# Patient Record
Sex: Female | Born: 1980 | Race: Black or African American | Hispanic: No | Marital: Single | Smoking: Former smoker
Health system: Southern US, Community
[De-identification: ages and names within clinical notes are randomized; demographics above are authoritative.]

---

## 2005-02-06 ENCOUNTER — Emergency Department: Payer: Self-pay | Admitting: Emergency Medicine

## 2005-02-08 ENCOUNTER — Ambulatory Visit: Payer: Self-pay | Admitting: Emergency Medicine

## 2005-02-09 ENCOUNTER — Observation Stay: Payer: Self-pay

## 2006-12-22 ENCOUNTER — Emergency Department: Payer: Self-pay | Admitting: Internal Medicine

## 2008-01-30 ENCOUNTER — Emergency Department: Payer: Self-pay | Admitting: Emergency Medicine

## 2008-11-12 ENCOUNTER — Inpatient Hospital Stay: Payer: Self-pay

## 2009-10-08 ENCOUNTER — Emergency Department: Payer: Self-pay | Admitting: Emergency Medicine

## 2009-11-10 ENCOUNTER — Inpatient Hospital Stay: Payer: Self-pay | Admitting: Unknown Physician Specialty

## 2009-11-26 ENCOUNTER — Encounter: Payer: Self-pay | Admitting: Maternal & Fetal Medicine

## 2009-12-03 ENCOUNTER — Encounter: Payer: Self-pay | Admitting: Obstetrics and Gynecology

## 2010-02-03 ENCOUNTER — Observation Stay: Payer: Self-pay

## 2010-02-15 ENCOUNTER — Inpatient Hospital Stay: Payer: Self-pay | Admitting: Obstetrics and Gynecology

## 2011-08-06 IMAGING — US US FETAL BPP W/O NON-STRESS - NRPT
1 series · 12 of 12 positions shown · non-contrast
Comparison: none

[Series 1: us fetal bpp w/o non-stress - nrpt · 12 of 12 slices shown]
[im 1/12]
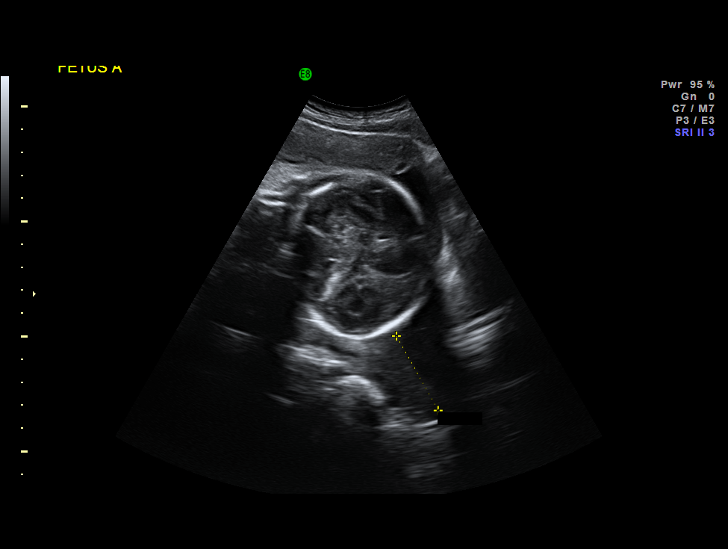
[im 2/12]
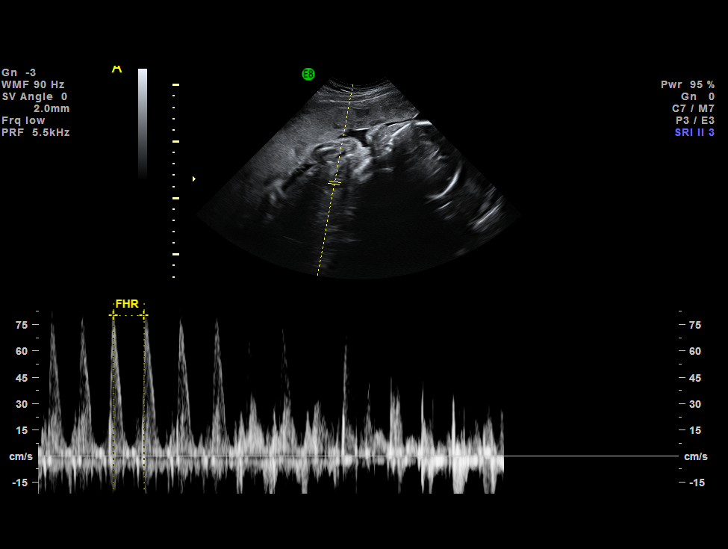
[im 3/12]
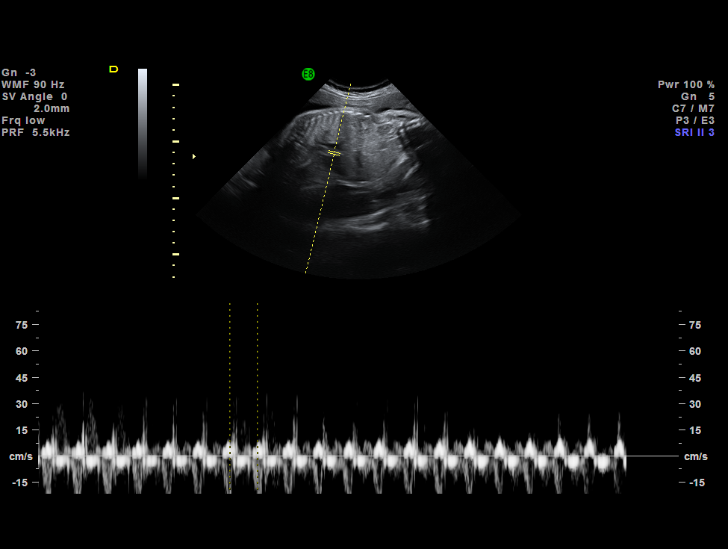
[im 4/12]
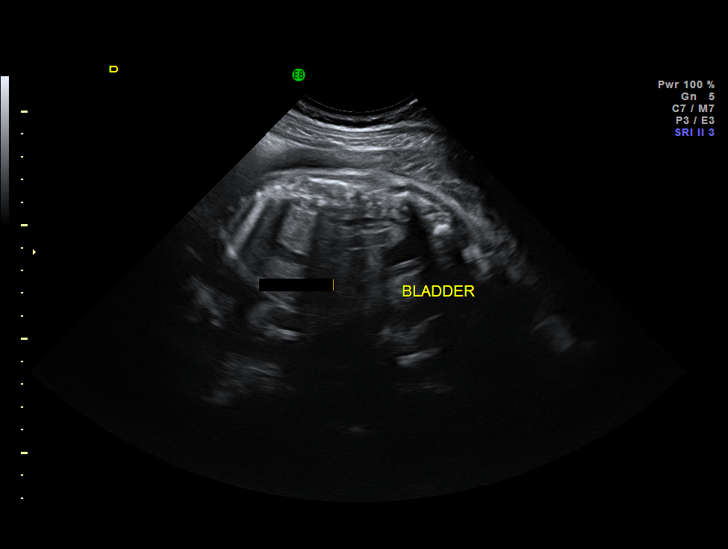
[im 5/12]
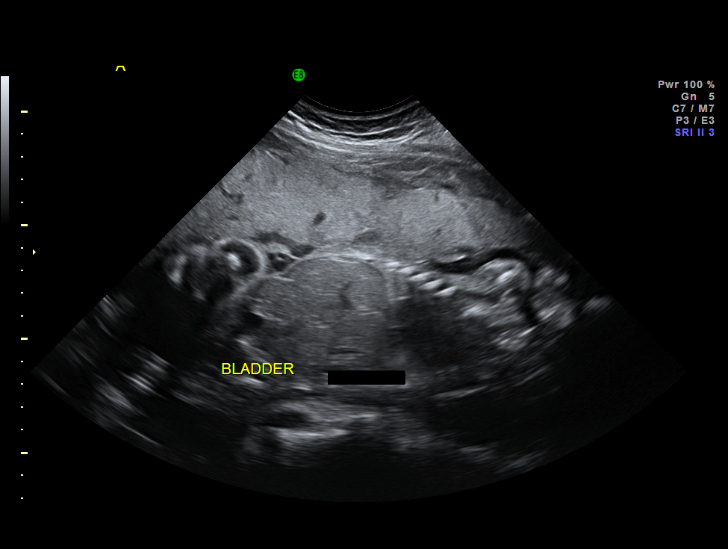
[im 6/12]
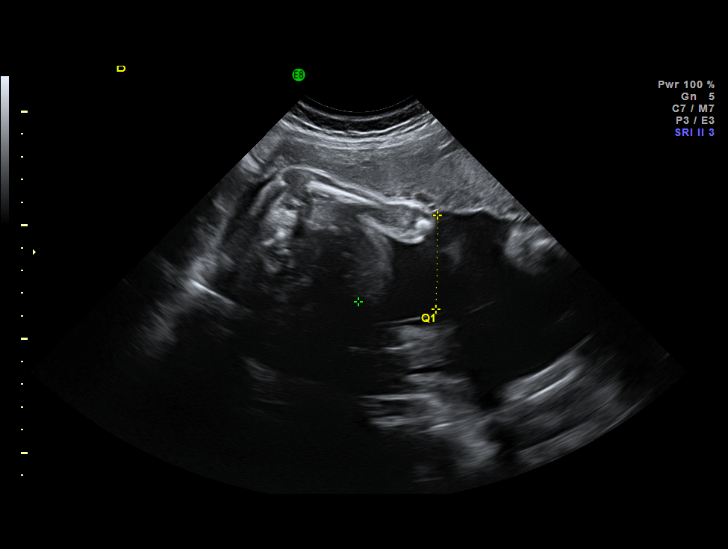
[im 7/12]
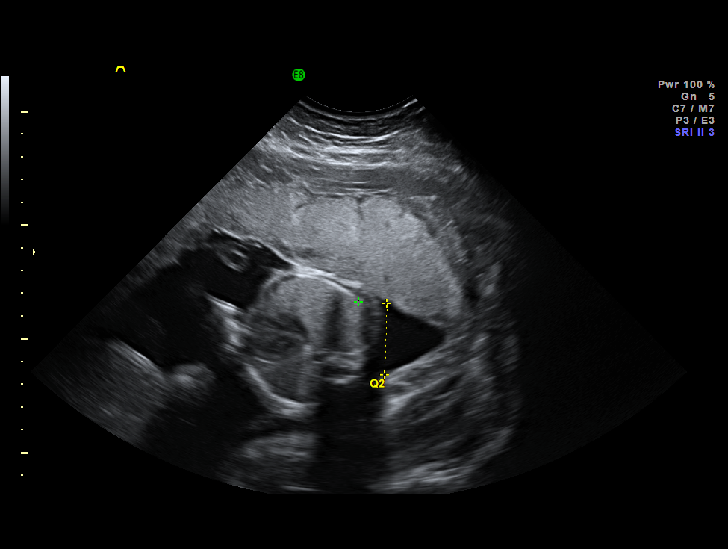
[im 8/12]
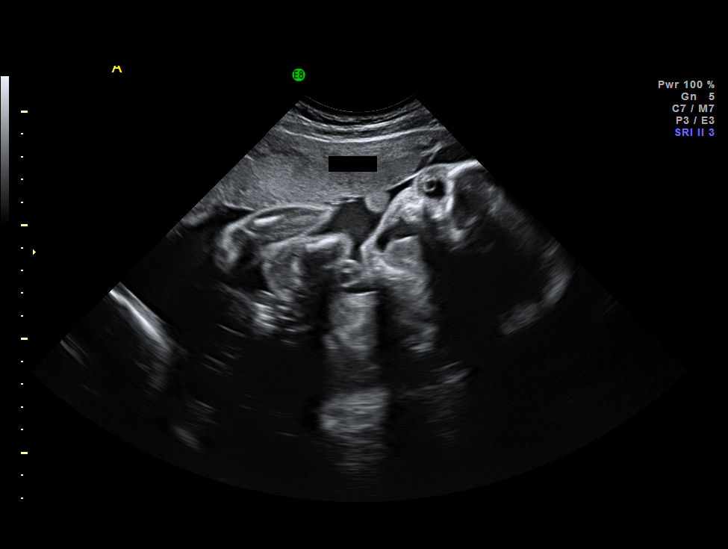
[im 9/12]
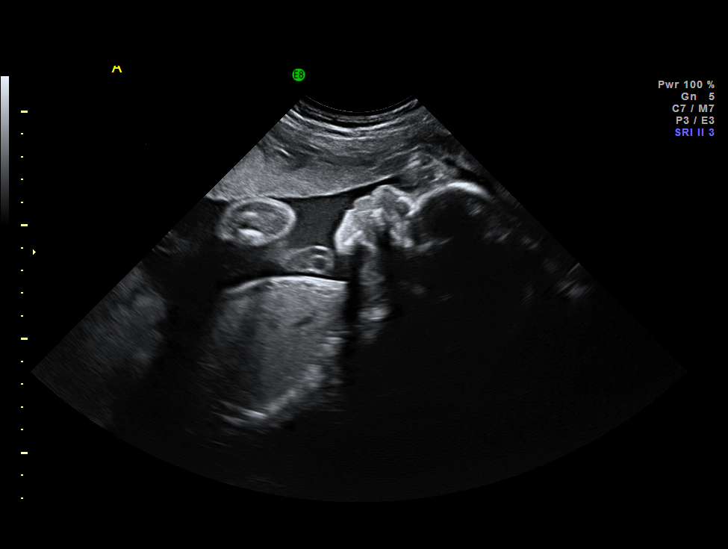
[im 10/12]
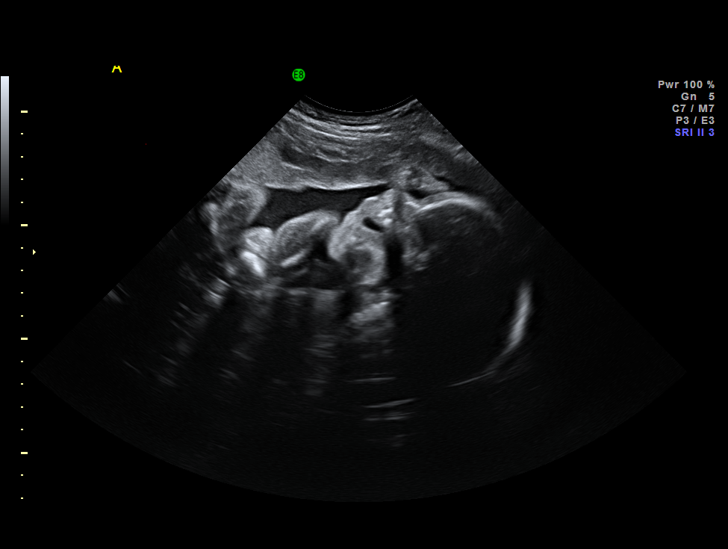
[im 11/12]
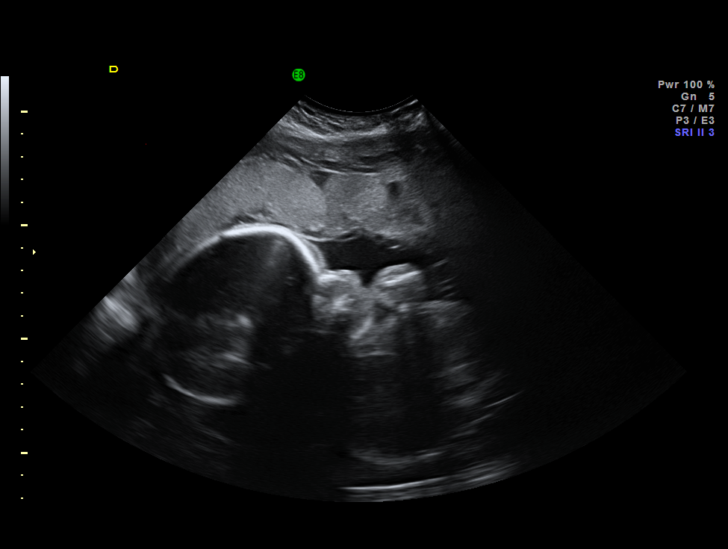
[im 12/12]
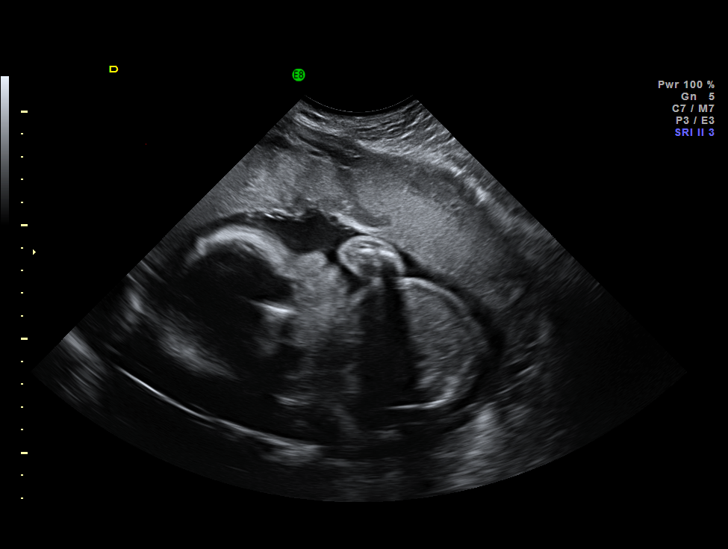

[12 of 12 positions shown; findings below may reference images not displayed]

IMAGES IMPORTED FROM THE [employer] WORKFLOW SYSTEM
NO DICTATION FOR STUDY

## 2015-02-21 ENCOUNTER — Encounter: Payer: Self-pay | Admitting: Emergency Medicine

## 2015-02-21 ENCOUNTER — Emergency Department
Admission: EM | Admit: 2015-02-21 | Discharge: 2015-02-21 | Disposition: A | Discharge status: Home / Self Care | Payer: Self-pay | Attending: Emergency Medicine | Admitting: Emergency Medicine

## 2015-02-21 DIAGNOSIS — Z720 Tobacco use: Secondary | ICD-10-CM | POA: Insufficient documentation

## 2015-02-21 DIAGNOSIS — R51 Headache: Secondary | ICD-10-CM | POA: Insufficient documentation

## 2015-02-21 DIAGNOSIS — H538 Other visual disturbances: Secondary | ICD-10-CM | POA: Insufficient documentation

## 2015-02-21 DIAGNOSIS — J020 Streptococcal pharyngitis: Secondary | ICD-10-CM | POA: Insufficient documentation

## 2015-02-21 DIAGNOSIS — R42 Dizziness and giddiness: Secondary | ICD-10-CM | POA: Insufficient documentation

## 2015-02-21 DIAGNOSIS — R739 Hyperglycemia, unspecified: Secondary | ICD-10-CM | POA: Insufficient documentation

## 2015-02-21 DIAGNOSIS — R531 Weakness: Secondary | ICD-10-CM | POA: Insufficient documentation

## 2015-02-21 LAB — BASIC METABOLIC PANEL
Anion gap: 9 (ref 5–15)
BUN: 9 mg/dL (ref 6–20)
CO2: 20 mmol/L — ABNORMAL LOW (ref 22–32)
CREATININE: 1 mg/dL (ref 0.44–1.00)
Calcium: 8.6 mg/dL — ABNORMAL LOW (ref 8.9–10.3)
Chloride: 101 mmol/L (ref 101–111)
GFR calc Af Amer: >60 mL/min (ref >60)
GFR calc non Af Amer: >60 mL/min (ref >60)
GLUCOSE: 202 mg/dL — AB (ref 65–99)
Potassium: 3.5 mmol/L (ref 3.5–5.1)
Sodium: 130 mmol/L — ABNORMAL LOW (ref 135–145)

## 2015-02-21 LAB — CBC
HCT: 37.9 % (ref 35.0–47.0)
Hemoglobin: 12.2 g/dL (ref 12.0–16.0)
MCH: 24.1 pg — ABNORMAL LOW (ref 26.0–34.0)
MCHC: 32.1 g/dL (ref 32.0–36.0)
MCV: 75 fL — ABNORMAL LOW (ref 80.0–100.0)
PLATELETS: 270 10*3/uL (ref 150–440)
RBC: 5.06 MIL/uL (ref 3.80–5.20)
RDW: 15.2 % — AB (ref 11.5–14.5)
WBC: 13.3 10*3/uL — ABNORMAL HIGH (ref 3.6–11.0)

## 2015-02-21 MED ORDER — SODIUM CHLORIDE 0.9 % IV BOLUS (SEPSIS)
1000.0000 mL | Freq: Once | INTRAVENOUS | Status: AC
  Administered 2015-02-21: 1000 mL via INTRAVENOUS

## 2015-02-21 MED ORDER — ACETAMINOPHEN 500 MG PO TABS
1000.0000 mg | ORAL_TABLET | Freq: Once | ORAL | Status: AC
  Administered 2015-02-21: 1000 mg via ORAL

## 2015-02-21 MED ORDER — PENICILLIN G BENZATHINE 1200000 UNIT/2ML IM SUSP
1.2000 10*6.[IU] | Freq: Once | INTRAMUSCULAR | Status: AC
  Administered 2015-02-21: 1.2 10*6.[IU] via INTRAMUSCULAR
  Filled 2015-02-21: qty 2

## 2015-02-21 MED ORDER — ACETAMINOPHEN 500 MG PO TABS
ORAL_TABLET | ORAL | Status: AC
  Filled 2015-02-21: qty 2

## 2015-02-21 MED ORDER — IBUPROFEN 800 MG PO TABS
800.0000 mg | ORAL_TABLET | Freq: Once | ORAL | Status: AC
  Administered 2015-02-21: 800 mg via ORAL
  Filled 2015-02-21: qty 1

## 2015-02-21 NOTE — ED Notes (Signed)
Pt states she continues to feel dizzy, md schaevitz notified.

## 2015-02-21 NOTE — ED Provider Notes (Addendum)
[facility] Emergency Department Provider Note  ____________________________________________  Time seen: Approximately 615 PM  I have reviewed the triage vital signs and the nursing notes.   HISTORY  Chief Complaint Headache; Neck Pain; Dizziness; Weakness; and Blurred Vision    HPI Madeline Montgomery is a 34 y.o. female without any pertinent medical history who presents today with anterior throat pain and anterior headache. She says that her symptoms began yesterday. She denies any sick contacts. Denies a cough, runny nose, nausea vomiting or diarrhea.   History reviewed. No pertinent past medical history.  There are no active problems to display for this patient.   History reviewed. No pertinent past surgical history.  No current outpatient prescriptions on file.  Allergies Review of patient's allergies indicates no known allergies.  No family history on file.  Social History History  Substance Use Topics  . Smoking status: Current Some Day Smoker -- 0.00 packs/day    Types: Cigarettes  . Smokeless tobacco: Not on file  . Alcohol Use: Yes     Comment: OCCAS    Review of Systems Constitutional: Fever and chills Eyes: No visual changes. ENT: Positive for sore throat. Cardiovascular: Denies chest pain. Respiratory: Denies shortness of breath. Gastrointestinal: No abdominal pain.  No nausea, no vomiting.  No diarrhea.  No constipation. Genitourinary: Negative for dysuria. Musculoskeletal: Negative for back pain. Skin: Negative for rash. Neurological: Negative for headaches, focal weakness or numbness.  10-point ROS otherwise negative.  ____________________________________________   PHYSICAL EXAM:  VITAL SIGNS: ED Triage Vitals  Enc Vitals Group     BP 02/21/15 1745 136/71 mmHg     Pulse Rate 02/21/15 1745 128     Resp 02/21/15 1745 18     Temp 02/21/15 1745 103 F (39.4 C)     Temp src --      SpO2 02/21/15 1745 97 %      Weight 02/21/15 1745 220 lb (99.791 kg)     Height 02/21/15 1745  (1.702 m)     Head Cir --      Peak Flow --      Pain Score 02/21/15 1746 10     Pain Loc --      Pain Edu? --      Excl. in GC? --     Constitutional: Alert and oriented. Well appearing and in no acute distress. Eyes: Conjunctivae are normal. PERRL. EOMI. Head: Atraumatic. Nose: No congestion/rhinnorhea. Mouth/Throat: Mucous membranes are moist.  Beefy-red pharynx with right sided pus overlying the tonsil. No tonsillar swelling or uvular deviation. The patient is controlling her secretions. Neck: No stridor.  Ranges freely. No meningismus. Hematological/Lymphatic/Immunilogical: Palpable and tender anterior cervical lymphadenopathy. Cardiovascular: Normal rate, regular rhythm. Grossly normal heart sounds.  Good peripheral circulation. Respiratory: Normal respiratory effort.  No retractions. Lungs CTAB. Gastrointestinal: Soft and nontender. No distention. No abdominal bruits. No CVA tenderness. Musculoskeletal: No lower extremity tenderness nor edema.  No joint effusions. Neurologic:  Normal speech and language. No gross focal neurologic deficits are appreciated. No gait instability. Skin:  Skin is warm, dry and intact. No rash noted. Psychiatric: Mood and affect are normal. Speech and behavior are normal.  ____________________________________________   LABS (all labs ordered are listed, but only abnormal results are displayed)  Labs Reviewed  CBC - Abnormal; Notable for the following:    WBC 13.3 (*)    MCV 75.0 (*)    MCH 24.1 (*)    RDW 15.2 (*)  All other components within normal limits  BASIC METABOLIC PANEL - Abnormal; Notable for the following:    Sodium 130 (*)    CO2 20 (*)    Glucose, Bld 202 (*)    Calcium 8.6 (*)    All other components within normal limits    ____________________________________________  EKG   ____________________________________________  RADIOLOGY   ____________________________________________   PROCEDURES    ____________________________________________   INITIAL IMPRESSION / ASSESSMENT AND PLAN / ED COURSE  Pertinent labs & imaging results that were available during my care of the patient were reviewed by me and considered in my medical decision making (see chart for details).  ----------------------------------------- 8:50 PM on 02/21/2015 -----------------------------------------  Patient with improved symptoms at this time. Recheck temperature and 99.1. Says it does have some mild lightheadedness but is no longer dizzy. Feeling overall improved. Symptoms likely all explained by her strep pharyngitis. I did discuss her elevated blood glucose with the patient. However, with a nonfasting glucose in the context of a fever and illness I will not start her on any anti-hyperglycemics. She will follow up with the primary care doctor. She has insurance and will be able to follow-up. I will give her the number for the [facility] clinic. ____________________________________________   FINAL CLINICAL IMPRESSION(S) / ED DIAGNOSES  Acute strep pharyngitis. Acute hyperglycemia. Initial visit.    Myrna Blazer, MD 02/21/15 2051  Patient knows to stay hydrated at home as well as to continue with ibuprofen and Tylenol for fever and pain relief.  Myrna Blazer, MD 02/21/15 2053  Initially patient was being discharged once that up became dizzy. Given liter of fluid. Now feeling better. Likely with mild dehydration. Encouraged to drink plenty fluids at home. Given work note for tomorrow.  Myrna Blazer, MD 02/21/15 2206

## 2015-02-21 NOTE — ED Notes (Signed)
Pt here with c/o blurred vision, headache, neck pain, weakness states she "feels like she is going to pass out," all began yesterday. Temp 103, HR 128.

## 2015-02-24 ENCOUNTER — Emergency Department
Admission: EM | Admit: 2015-02-24 | Discharge: 2015-02-25 | Disposition: A | Discharge status: Home / Self Care | Payer: Self-pay | Attending: Emergency Medicine | Admitting: Emergency Medicine

## 2015-02-24 ENCOUNTER — Emergency Department: Payer: Self-pay

## 2015-02-24 ENCOUNTER — Encounter: Payer: Self-pay | Admitting: Emergency Medicine

## 2015-02-24 DIAGNOSIS — J029 Acute pharyngitis, unspecified: Secondary | ICD-10-CM | POA: Insufficient documentation

## 2015-02-24 DIAGNOSIS — Z720 Tobacco use: Secondary | ICD-10-CM | POA: Insufficient documentation

## 2015-02-24 LAB — COMPREHENSIVE METABOLIC PANEL
ALK PHOS: 79 U/L (ref 38–126)
ALT: 24 U/L (ref 14–54)
ANION GAP: 10 (ref 5–15)
AST: 22 U/L (ref 15–41)
Albumin: 3.7 g/dL (ref 3.5–5.0)
BILIRUBIN TOTAL: 0.3 mg/dL (ref 0.3–1.2)
BUN: 9 mg/dL (ref 6–20)
CALCIUM: 8.8 mg/dL — AB (ref 8.9–10.3)
CO2: 24 mmol/L (ref 22–32)
CREATININE: 0.79 mg/dL (ref 0.44–1.00)
Chloride: 101 mmol/L (ref 101–111)
GFR calc Af Amer: >60 mL/min (ref >60)
Glucose, Bld: 96 mg/dL (ref 65–99)
Potassium: 3.7 mmol/L (ref 3.5–5.1)
SODIUM: 135 mmol/L (ref 135–145)
Total Protein: 7.8 g/dL (ref 6.5–8.1)

## 2015-02-24 LAB — CBC WITH DIFFERENTIAL/PLATELET
Basophils Absolute: 0 10*3/uL (ref 0–0.1)
Basophils Relative: 1 %
Eosinophils Absolute: 0 10*3/uL (ref 0–0.7)
Eosinophils Relative: 0 %
HEMATOCRIT: 34.2 % — AB (ref 35.0–47.0)
HEMOGLOBIN: 11.1 g/dL — AB (ref 12.0–16.0)
LYMPHS PCT: 11 %
Lymphs Abs: 1.1 10*3/uL (ref 1.0–3.6)
MCH: 24.3 pg — ABNORMAL LOW (ref 26.0–34.0)
MCHC: 32.4 g/dL (ref 32.0–36.0)
MCV: 74.7 fL — ABNORMAL LOW (ref 80.0–100.0)
MONO ABS: 1.2 10*3/uL — AB (ref 0.2–0.9)
MONOS PCT: 12 %
Neutro Abs: 7.7 10*3/uL — ABNORMAL HIGH (ref 1.4–6.5)
Neutrophils Relative %: 76 %
PLATELETS: 274 10*3/uL (ref 150–440)
RBC: 4.58 MIL/uL (ref 3.80–5.20)
RDW: 15.2 % — ABNORMAL HIGH (ref 11.5–14.5)
WBC: 10 10*3/uL (ref 3.6–11.0)

## 2015-02-24 MED ORDER — IOHEXOL 300 MG/ML  SOLN
75.0000 mL | Freq: Once | INTRAMUSCULAR | Status: AC | PRN
  Administered 2015-02-24: 75 mL via INTRAVENOUS

## 2015-02-24 NOTE — ED Notes (Addendum)
Pt arrived to the ED for complaints of sore throat, generalized malaise and stiff neck. Pt states that she was seen in this ED 2 days ago, diagnosed with strep-troat and given a penicillin shot. Pt states that the symptoms had not gone away and progressively getting worse. Pt is unable to move her neck. Pt is AOx4.

## 2015-02-24 NOTE — ED Provider Notes (Signed)
[facility] Emergency Department Provider Note  ____________________________________________  Time seen: 11:20 PM  I have reviewed the triage vital signs and the nursing notes.   HISTORY  Chief Complaint Sore Throat     HPI Madeline Montgomery is a 34 y.o. female presents with sore throat and generalized malaise left neck stiffness. Patient was seen in the emergency department 2 days prior and diagnosed with strep throat and received a IM shot of penicillin. Patient now returns stating that symptoms have progressively worsened since that time.     Past Medical history Strep pharyngitis There are no active problems to display for this patient.   History reviewed. No pertinent past surgical history.  No current outpatient prescriptions on file.  Allergies No known drug allergies History reviewed. No pertinent family history.  Social History History  Substance Use Topics  . Smoking status: Current Some Day Smoker -- 0.00 packs/day    Types: Cigarettes  . Smokeless tobacco: Not on file  . Alcohol Use: Yes     Comment: OCCAS    Review of Systems  Constitutional: Negative for fever. Eyes: Negative for visual changes. ENT: Positive for sore throat. Cardiovascular: Negative for chest pain. Respiratory: Negative for shortness of breath. Gastrointestinal: Negative for abdominal pain, vomiting and diarrhea. Genitourinary: Negative for dysuria. Musculoskeletal: Negative for back pain. Skin: Negative for rash. Neurological: Negative for headaches, focal weakness or numbness.   10-point ROS otherwise negative.  ____________________________________________   PHYSICAL EXAM:  VITAL SIGNS: ED Triage Vitals  Enc Vitals Group     BP 02/24/15 2236 127/66 mmHg     Pulse Rate 02/24/15 2236 92     Resp 02/24/15 2236 18     Temp 02/24/15 2236 98.5 F (36.9 C)     Temp Source 02/24/15 2236 Oral     SpO2 02/24/15 2236 100 %     Weight 02/24/15  2236 220 lb (99.791 kg)     Height 02/24/15 2236  (1.702 m)     Head Cir --      Peak Flow --      Pain Score 02/24/15 2237 10     Pain Loc --      Pain Edu? --      Excl. in GC? --      Constitutional: Alert and oriented. Well appearing and in no distress. Eyes: Conjunctivae are normal. PERRL. Normal extraocular movements. ENT   Head: Normocephalic and atraumatic.   Nose: No congestion/rhinnorhea.   Mouth/Throat: Mucous membranes are moist. Pharyngeal erythema noted with scant exudate.   Neck: No stridor. Hematological/Lymphatic/Immunilogical: No cervical lymphadenopathy. Cardiovascular: Normal rate, regular rhythm. Normal and symmetric distal pulses are present in all extremities. No murmurs, rubs, or gallops. Respiratory: Normal respiratory effort without tachypnea nor retractions. Breath sounds are clear and equal bilaterally. No wheezes/rales/rhonchi. Gastrointestinal: Soft and nontender. No distention. There is no CVA tenderness. Genitourinary: deferred Musculoskeletal: Nontender with normal range of motion in all extremities. No joint effusions.  No lower extremity tenderness nor edema. Neurologic:  Normal speech and language. No gross focal neurologic deficits are appreciated. Speech is normal.  Skin:  Skin is warm, dry and intact. No rash noted. Psychiatric: Mood and affect are normal. Speech and behavior are normal. Patient exhibits appropriate insight and judgment.  ____________________________________________    LABS (pertinent positives/negatives)  Labs Reviewed  CBC WITH DIFFERENTIAL/PLATELET - Abnormal; Notable for the following:    Hemoglobin 11.1 (*)    HCT 34.2 (*)    MCV  74.7 (*)    MCH 24.3 (*)    RDW 15.2 (*)    Neutro Abs 7.7 (*)    Monocytes Absolute 1.2 (*)    All other components within normal limits  COMPREHENSIVE METABOLIC PANEL - Abnormal; Notable for the following:    Calcium 8.8 (*)    All other components within normal  limits     RADIOLOGY CT NECK WITH CONTRAST  TECHNIQUE: Multidetector CT imaging of the neck was performed using the standard protocol following the bolus administration of intravenous contrast.  CONTRAST: 75mL OMNIPAQUE IOHEXOL 300 MG/ML SOLN  COMPARISON: None.  FINDINGS: Visualized portions of the brain are normal in appearance. Partially visualized globes and orbits demonstrate no acute abnormality. Visualized paranasal sinuses and mastoid air cells are clear.  Salivary glands including the parotid glands and submandibular glands are normal in appearance.  Oral cavity is unremarkable without evidence of mass lesion or loculated fluid collection. The palatine tonsils are fairly symmetric in size and appearance bilaterally without CT evidence for acute tonsillitis or significant inflammatory changes. No peritonsillar abscess. Parapharyngeal fat preserved. Oropharynx and nasopharynx within normal limits.  No retropharyngeal fluid collection. Epiglottis is normal. Vallecula is clear. Remainder of the hypopharynx and supraglottic larynx are within normal limits. True vocal cords are symmetric bilaterally. Subglottic airway is clear.  Thyroid gland is normal.  Bilateral level 2 adenopathy is present, measuring up to 18 mm on the left and 15 mm on the right. This is likely reactive in nature. Shotty subcentimeter bilateral level 3 nodes present. No other pathologically enlarged lymph nodes identified within the neck.  Visualized portions of the superior mediastinum are within normal limits.  Normal intravascular enhancement seen within the neck.  Visualized lungs are clear.  No acute osseous abnormality. No worrisome lytic or blastic osseous lesions.  IMPRESSION: 1. No significant inflammatory changes identified within the neck. No tonsillar swelling or evidence of peritonsillar abscess. 2. Mildly enlarged bilateral level II adenopathy, likely reactive. Clinical  followup to resolution recommended.   Electronically Signed By: Rise Mu M.D. On: 02/25/2015 00:11          INITIAL IMPRESSION / ASSESSMENT AND PLAN / ED COURSE  Pertinent labs & imaging results that were available during my care of the patient were reviewed by me and considered in my medical decision making (see chart for details).  Patient prescribed Augmentin for home. Advised to follow-up with primary care physician  ____________________________________________   FINAL CLINICAL IMPRESSION(S) / ED DIAGNOSES  Final diagnoses:  Acute pharyngitis, unspecified pharyngitis type      Darci Current, MD 02/26/15 [phone]

## 2015-02-25 MED ORDER — AMOXICILLIN-POT CLAVULANATE 875-125 MG PO TABS
1.0000 | ORAL_TABLET | Freq: Once | ORAL | Status: AC
  Administered 2015-02-25: 1 via ORAL
  Filled 2015-02-25: qty 1

## 2015-02-25 MED ORDER — SODIUM CHLORIDE 0.9 % IV BOLUS (SEPSIS)
1000.0000 mL | Freq: Once | INTRAVENOUS | Status: AC
  Administered 2015-02-25: 1000 mL via INTRAVENOUS

## 2015-02-25 MED ORDER — AMOXICILLIN-POT CLAVULANATE 875-125 MG PO TABS: 1.0000 | ORAL_TABLET | Freq: Two times a day (BID) | ORAL | Status: AC

## 2015-02-25 NOTE — Discharge Instructions (Signed)

## 2015-09-12 ENCOUNTER — Encounter: Payer: Self-pay | Admitting: *Deleted

## 2015-09-12 ENCOUNTER — Emergency Department
Admission: EM | Admit: 2015-09-12 | Discharge: 2015-09-13 | Disposition: A | Discharge status: Home / Self Care | Payer: Self-pay | Attending: Emergency Medicine | Admitting: Emergency Medicine

## 2015-09-12 DIAGNOSIS — O9989 Other specified diseases and conditions complicating pregnancy, childbirth and the puerperium: Secondary | ICD-10-CM | POA: Insufficient documentation

## 2015-09-12 DIAGNOSIS — B9689 Other specified bacterial agents as the cause of diseases classified elsewhere: Secondary | ICD-10-CM

## 2015-09-12 DIAGNOSIS — N760 Acute vaginitis: Secondary | ICD-10-CM

## 2015-09-12 DIAGNOSIS — O23591 Infection of other part of genital tract in pregnancy, first trimester: Secondary | ICD-10-CM | POA: Insufficient documentation

## 2015-09-12 DIAGNOSIS — O210 Mild hyperemesis gravidarum: Secondary | ICD-10-CM | POA: Insufficient documentation

## 2015-09-12 DIAGNOSIS — R42 Dizziness and giddiness: Secondary | ICD-10-CM | POA: Insufficient documentation

## 2015-09-12 DIAGNOSIS — Z3A10 10 weeks gestation of pregnancy: Secondary | ICD-10-CM | POA: Insufficient documentation

## 2015-09-12 DIAGNOSIS — Z87891 Personal history of nicotine dependence: Secondary | ICD-10-CM | POA: Insufficient documentation

## 2015-09-12 LAB — BASIC METABOLIC PANEL
Anion gap: 10 (ref 5–15)
BUN: 6 mg/dL (ref 6–20)
CALCIUM: 9 mg/dL (ref 8.9–10.3)
CO2: 20 mmol/L — AB (ref 22–32)
CREATININE: 0.66 mg/dL (ref 0.44–1.00)
Chloride: 103 mmol/L (ref 101–111)
GFR calc non Af Amer: >60 mL/min (ref >60)
Glucose, Bld: 80 mg/dL (ref 65–99)
Potassium: 3.6 mmol/L (ref 3.5–5.1)
Sodium: 133 mmol/L — ABNORMAL LOW (ref 135–145)

## 2015-09-12 LAB — CBC
HCT: 37.2 % (ref 35.0–47.0)
Hemoglobin: 12.3 g/dL (ref 12.0–16.0)
MCH: 25.6 pg — AB (ref 26.0–34.0)
MCHC: 33.1 g/dL (ref 32.0–36.0)
MCV: 77.5 fL — ABNORMAL LOW (ref 80.0–100.0)
PLATELETS: 317 10*3/uL (ref 150–440)
RBC: 4.8 MIL/uL (ref 3.80–5.20)
RDW: 14.5 % (ref 11.5–14.5)
WBC: 9.1 10*3/uL (ref 3.6–11.0)

## 2015-09-12 LAB — URINALYSIS COMPLETE WITH MICROSCOPIC (ARMC ONLY)
BILIRUBIN URINE: NEGATIVE
GLUCOSE, UA: NEGATIVE mg/dL
Nitrite: NEGATIVE
Protein, ur: 100 mg/dL — AB
Specific Gravity, Urine: 1.003 — ABNORMAL LOW (ref 1.005–1.030)
pH: 6 (ref 5.0–8.0)

## 2015-09-12 NOTE — ED Notes (Addendum)
Pt arrived to ED reporting dizziness x 1 week that has progressively become worse. Pt reports vomiting 5 times today and having increased dizziness when going from seated to standing. Pt denies syncopal episode or diarrhea at this time. Pt is alert and oriented x 4 and appears to be in no acute distress in triage. Pt reports having thick yellow and foul smelling discharge from vagina. Pt is pregnant at this time but is not sure how far along she is.

## 2015-09-13 ENCOUNTER — Emergency Department: Payer: Self-pay

## 2015-09-13 ENCOUNTER — Encounter: Payer: Self-pay | Admitting: Emergency Medicine

## 2015-09-13 LAB — TROPONIN I: Troponin I: <0.03 ng/mL (ref <0.031)

## 2015-09-13 LAB — CHLAMYDIA/NGC RT PCR (ARMC ONLY)
Chlamydia Tr: NOT DETECTED
N gonorrhoeae: NOT DETECTED

## 2015-09-13 LAB — HCG, QUANTITATIVE, PREGNANCY: hCG, Beta Chain, Quant, S: 168901 m[IU]/mL — ABNORMAL HIGH (ref <5)

## 2015-09-13 LAB — WET PREP, GENITAL
Sperm: NONE SEEN
Trich, Wet Prep: NONE SEEN
Yeast Wet Prep HPF POC: NONE SEEN

## 2015-09-13 MED ORDER — METOCLOPRAMIDE HCL 5 MG/ML IJ SOLN
10.0000 mg | Freq: Once | INTRAMUSCULAR | Status: AC
Start: 2015-09-13 — End: 2015-09-13
  Administered 2015-09-13: 10 mg via INTRAVENOUS
  Filled 2015-09-13: qty 2

## 2015-09-13 MED ORDER — METOCLOPRAMIDE HCL 10 MG PO TABS: 10.0000 mg | ORAL_TABLET | Freq: Three times a day (TID) | ORAL | Status: AC | PRN

## 2015-09-13 MED ORDER — METRONIDAZOLE 500 MG PO TABS
500.0000 mg | ORAL_TABLET | Freq: Once | ORAL | Status: AC
  Administered 2015-09-13: 500 mg via ORAL
  Filled 2015-09-13: qty 1

## 2015-09-13 MED ORDER — METRONIDAZOLE 500 MG PO TABS: 500.0000 mg | ORAL_TABLET | Freq: Two times a day (BID) | ORAL | Status: AC

## 2015-09-13 MED ORDER — SODIUM CHLORIDE 0.9 % IV BOLUS (SEPSIS)
1000.0000 mL | Freq: Once | INTRAVENOUS | Status: AC
Start: 2015-09-13 — End: 2015-09-13
  Administered 2015-09-13: 1000 mL via INTRAVENOUS

## 2015-09-13 NOTE — ED Notes (Signed)
Discussed IV options with patient, AC placement preferred. 

## 2015-09-13 NOTE — ED Notes (Signed)
Patient transported to Ultrasound 

## 2015-09-13 NOTE — ED Provider Notes (Signed)
[facility] Emergency Department Provider Note  ____________________________________________  Time seen: Approximately 0034 AM  I have reviewed the triage vital signs and the nursing notes.   HISTORY  Chief Complaint Dizziness    HPI Sindee DERA VANAKEN is a 35 y.o. female who comes into the hospital today with some dizziness nausea and vomiting. The patient reports her symptoms started a week ago. She deals as though the symptoms have been getting worse. The patient is pregnant and she is unsure how far along she is. The patient has an appointment with [facility] on Monday. Her last mention. Was in the middle of December and she is a G3 P2002. The patient denies burning with urination but has had some yellow vaginal discharge with lower abdominal pain on and off. The patient has some pressure in her lower abdomen as well as cramps. Her pain is a 7/10 in intensity. The patient reports that every time she eats or drinks she vomits. She hasn't eaten anything today and reports that her emesis is primarily liquid. The patient was concerned and was unsure what was going on so she decided to come into the emergency department for evaluation.   History reviewed. No pertinent past medical history.  There are no active problems to display for this patient.   History reviewed. No pertinent past surgical history.  Current Outpatient Rx  Name  Route  Sig  Dispense  Refill  . metoCLOPramide (REGLAN) 10 MG tablet   Oral   Take 1 tablet (10 mg total) by mouth every 8 (eight) hours as needed for nausea or vomiting.   20 tablet   0   . metroNIDAZOLE (FLAGYL) 500 MG tablet   Oral   Take 1 tablet (500 mg total) by mouth 2 (two) times daily.   14 tablet   0     Allergies Review of patient's allergies indicates no known allergies.  No family history on file.  Social History Social History  Substance Use Topics  . Smoking status: Former Smoker -- 0.00 packs/day     Types: Cigarettes  . Smokeless tobacco: None  . Alcohol Use: Yes     Comment: OCCAS    Review of Systems Constitutional: No fever/chills Eyes: No visual changes. ENT: No sore throat. Cardiovascular: Denies chest pain. Respiratory: Denies shortness of breath. Gastrointestinal:  abdominal pain,  nausea, vomiting.  No diarrhea.  No constipation. Genitourinary: Vaginal discharge with no dysuria Musculoskeletal: Negative for back pain. Skin: Negative for rash. Neurological: Dizziness  10-point ROS otherwise negative.  ____________________________________________   PHYSICAL EXAM:  VITAL SIGNS: ED Triage Vitals  Enc Vitals Group     BP 09/12/15 2041 123/73 mmHg     Pulse Rate 09/12/15 2041 79     Resp 09/12/15 2041 16     Temp 09/12/15 2041 98.2 F (36.8 C)     Temp Source 09/12/15 2041 Oral     SpO2 09/12/15 2041 97 %     Weight 09/12/15 2041 200 lb (90.719 kg)     Height 09/12/15 2041  (1.702 m)     Head Cir --      Peak Flow --      Pain Score 09/12/15 2043 10     Pain Loc --      Pain Edu? --      Excl. in GC? --     Constitutional: Alert and oriented. Well appearing and in no mild distress. Eyes: Conjunctivae are normal. PERRL. EOMI. Head: Atraumatic. Nose:  No congestion/rhinnorhea. Mouth/Throat: Mucous membranes are moist.  Oropharynx non-erythematous. Cardiovascular: Normal rate, regular rhythm. Grossly normal heart sounds.  Good peripheral circulation. Respiratory: Normal respiratory effort.  No retractions. Lungs CTAB. Gastrointestinal: Soft and nontender. No distention.   Musculoskeletal: No lower extremity tenderness nor edema.   Neurologic:  Normal speech and language.  Skin:  Skin is warm, dry and intact.  Psychiatric: Mood and affect are normal.   ____________________________________________   LABS (all labs ordered are listed, but only abnormal results are displayed)  Labs Reviewed  WET PREP, GENITAL - Abnormal; Notable for the following:     Clue Cells Wet Prep HPF POC PRESENT (*)    WBC, Wet Prep HPF POC MODERATE (*)    All other components within normal limits  BASIC METABOLIC PANEL - Abnormal; Notable for the following:    Sodium 133 (*)    CO2 20 (*)    All other components within normal limits  CBC - Abnormal; Notable for the following:    MCV 77.5 (*)    MCH 25.6 (*)    All other components within normal limits  URINALYSIS COMPLETEWITH MICROSCOPIC ([facility] ONLY) - Abnormal; Notable for the following:    Color, Urine YELLOW (*)    APPearance TURBID (*)    Ketones, ur 2+ (*)    Specific Gravity, Urine 1.003 (*)    Hgb urine dipstick 2+ (*)    Protein, ur 100 (*)    Leukocytes, UA 3+ (*)    Bacteria, UA FEW (*)    Squamous Epithelial / LPF 0-5 (*)    All other components within normal limits  HCG, QUANTITATIVE, PREGNANCY - Abnormal; Notable for the following:    hCG, Beta Chain, Sharene Butters, [place] [id number] (*)    All other components within normal limits  CHLAMYDIA/NGC RT PCR ([facility] ONLY)  URINE CULTURE  TROPONIN I  CBG MONITORING, ED   ____________________________________________  EKG  ED ECG REPORT I, Rebecka Apley, the attending physician, personally viewed and interpreted this ECG.   Date: 09/12/2015  EKG Time: 2039  Rate: 78  Rhythm: normal sinus rhythm  Axis: normal  Intervals:none  ST&T Change: none  ____________________________________________  RADIOLOGY  Pelvic ultrasound: Single live intrauterine pregnancy with an estimated gestational age of [redacted] weeks 1 day ____________________________________________   PROCEDURES  Procedure(s) performed: None  Critical Care performed: No  ____________________________________________   INITIAL IMPRESSION / ASSESSMENT AND PLAN / ED COURSE  Pertinent labs & imaging results that were available during my care of the patient were reviewed by me and considered in my medical decision making (see chart for details).  This is a 35 year old female  who comes into the hospital today with some dizziness and vaginal discharge. The patient is pregnant although she is unsure how far along. The patient does have some clue cells in her wet prep as well as white blood cells and red blood cells. I will send the patient for an ultrasound and reassess the patient when she's received the results. I will give her a liter of normal saline as well as some Reglan and then attempt a by mouth trial.  The patient did receive some metronidazole which he was able to take after the Reglan. The patient's ultrasound does not show any cysts or any other infection causing her symptoms. The patient will be discharged home to follow-up with [facility] as scheduled. ____________________________________________   FINAL CLINICAL IMPRESSION(S) / ED DIAGNOSES  Final diagnoses:  Hyperemesis gravidarum  Bacterial vaginosis  Dizziness      Rebecka Apley, MD 09/13/15 [phone]

## 2015-09-13 NOTE — ED Notes (Signed)
Patient returned from US.

## 2015-09-13 NOTE — Discharge Instructions (Signed)
Bacterial Vaginosis Bacterial vaginosis is a vaginal infection that occurs when the normal balance of bacteria in the vagina is disrupted. It results from an overgrowth of certain bacteria. This is the most common vaginal infection in women of childbearing age. Treatment is important to prevent complications, especially in pregnant women, as it can cause a premature delivery. CAUSES  Bacterial vaginosis is caused by an increase in harmful bacteria that are normally present in smaller amounts in the vagina. Several different kinds of bacteria can cause bacterial vaginosis. However, the reason that the condition develops is not fully understood. RISK FACTORS Certain activities or behaviors can put you at an increased risk of developing bacterial vaginosis, including:  Having a new sex partner or multiple sex partners.  Douching.  Using an intrauterine device (IUD) for contraception. Women do not get bacterial vaginosis from toilet seats, bedding, swimming pools, or contact with objects around them. SIGNS AND SYMPTOMS  Some women with bacterial vaginosis have no signs or symptoms. Common symptoms include:  Grey vaginal discharge.  A fishlike odor with discharge, especially after sexual intercourse.  Itching or burning of the vagina and vulva.  Burning or pain with urination. DIAGNOSIS  Your health care provider will take a medical history and examine the vagina for signs of bacterial vaginosis. A sample of vaginal fluid may be taken. Your health care provider will look at this sample under a microscope to check for bacteria and abnormal cells. A vaginal pH test may also be done.  TREATMENT  Bacterial vaginosis may be treated with antibiotic medicines. These may be given in the form of a pill or a vaginal cream. A second round of antibiotics may be prescribed if the condition comes back after treatment. Because bacterial vaginosis increases your risk for sexually transmitted diseases, getting  treated can help reduce your risk for chlamydia, gonorrhea, HIV, and herpes. HOME CARE INSTRUCTIONS   Only take over-the-counter or prescription medicines as directed by your health care provider.  If antibiotic medicine was prescribed, take it as directed. Make sure you finish it even if you start to feel better.  Tell all sexual partners that you have a vaginal infection. They should see their health care provider and be treated if they have problems, such as a mild rash or itching.  During treatment, it is important that you follow these instructions:  Avoid sexual activity or use condoms correctly.  Do not douche.  Avoid alcohol as directed by your health care provider.  Avoid breastfeeding as directed by your health care provider. SEEK MEDICAL CARE IF:   Your symptoms are not improving after 3 days of treatment.  You have increased discharge or pain.  You have a fever. MAKE SURE YOU:   Understand these instructions.  Will watch your condition.  Will get help right away if you are not doing well or get worse. FOR MORE INFORMATION  Centers for Disease Control and Prevention, Division of STD Prevention: SolutionApps.[employer]za American Sexual Health Association (ASHA): www.ashastd.org    This information is not intended to replace advice given to you by your health care provider. Make sure you discuss any questions you have with your health care provider.   Document Released: 07/11/2005 Document Revised: 08/01/2014 Document Reviewed: 02/20/2013 Elsevier Interactive Patient Education 2016 Elsevier Inc.  Dizziness Dizziness is a common problem. It is a feeling of unsteadiness or light-headedness. You may feel like you are about to faint. Dizziness can lead to injury if you stumble or fall. Anyone can become  dizzy, but dizziness is more common in older adults. This condition can be caused by a number of things, including medicines, dehydration, or illness. HOME CARE  INSTRUCTIONS Taking these steps may help with your condition: Eating and Drinking  Drink enough fluid to keep your urine clear or pale yellow. This helps to keep you from becoming dehydrated. Try to drink more clear fluids, such as water.  Do not drink alcohol.  Limit your caffeine intake if directed by your health care provider.  Limit your salt intake if directed by your health care provider. Activity  Avoid making quick movements.  Rise slowly from chairs and steady yourself until you feel okay.  In the morning, first sit up on the side of the bed. When you feel okay, stand slowly while you hold onto something until you know that your balance is fine.  Move your legs often if you need to stand in one place for a long time. Tighten and relax your muscles in your legs while you are standing.  Do not drive or operate heavy machinery if you feel dizzy.  Avoid bending down if you feel dizzy. Place items in your home so that they are easy for you to reach without leaning over. Lifestyle  Do not use any tobacco products, including cigarettes, chewing tobacco, or electronic cigarettes. If you need help quitting, ask your health care provider.  Try to reduce your stress level, such as with yoga or meditation. Talk with your health care provider if you need help. General Instructions  Watch your dizziness for any changes.  Take medicines only as directed by your health care provider. Talk with your health care provider if you think that your dizziness is caused by a medicine that you are taking.  Tell a friend or a family member that you are feeling dizzy. If he or she notices any changes in your behavior, have this person call your health care provider.  Keep all follow-up visits as directed by your health care provider. This is important. SEEK MEDICAL CARE IF:  Your dizziness does not go away.  Your dizziness or light-headedness gets worse.  You feel nauseous.  You have  reduced hearing.  You have new symptoms.  You are unsteady on your feet or you feel like the room is spinning. SEEK IMMEDIATE MEDICAL CARE IF:  You vomit or have diarrhea and are unable to eat or drink anything.  You have problems talking, walking, swallowing, or using your arms, hands, or legs.  You feel generally weak.  You are not thinking clearly or you have trouble forming sentences. It may take a friend or family member to notice this.  You have chest pain, abdominal pain, shortness of breath, or sweating.  Your vision changes.  You notice any bleeding.  You have a headache.  You have neck pain or a stiff neck.  You have a fever.   This information is not intended to replace advice given to you by your health care provider. Make sure you discuss any questions you have with your health care provider.   Document Released: 01/04/2001 Document Revised: 11/25/2014 Document Reviewed: 07/07/2014 Elsevier Interactive Patient Education 2016 Elsevier Inc.  Hyperemesis Gravidarum Hyperemesis gravidarum is a severe form of nausea and vomiting that happens during pregnancy. Hyperemesis is worse than morning sickness. It may cause you to have nausea or vomiting all day for many days. It may keep you from eating and drinking enough food and liquids. Hyperemesis usually occurs during the  first half (the first 20 weeks) of pregnancy. It often goes away once a woman is in her second half of pregnancy. However, sometimes hyperemesis continues through an entire pregnancy.  CAUSES  The cause of this condition is not completely known but is thought to be related to changes in the body's hormones when pregnant. It could be from the high level of the pregnancy hormone or an increase in estrogen in the body.  SIGNS AND SYMPTOMS   Severe nausea and vomiting.  Nausea that does not go away.  Vomiting that does not allow you to keep any food down.  Weight loss and body fluid loss  (dehydration).  Having no desire to eat or not liking food you have previously enjoyed. DIAGNOSIS  Your health care provider will do a physical exam and ask you about your symptoms. He or she may also order blood tests and urine tests to make sure something else is not causing the problem.  TREATMENT  You may only need medicine to control the problem. If medicines do not control the nausea and vomiting, you will be treated in the hospital to prevent dehydration, increased acid in the blood (acidosis), weight loss, and changes in the electrolytes in your body that may harm the unborn baby (fetus). You may need IV fluids.  HOME CARE INSTRUCTIONS   Only take over-the-counter or prescription medicines as directed by your health care provider.  Try eating a couple of dry crackers or toast in the morning before getting out of bed.  Avoid foods and smells that upset your stomach.  Avoid fatty and spicy foods.  Eat 5-6 small meals a day.  Do not drink when eating meals. Drink between meals.  For snacks, eat high-protein foods, such as cheese.  Eat or suck on things that have ginger in them. Ginger helps nausea.  Avoid food preparation. The smell of food can spoil your appetite.  Avoid iron pills and iron in your multivitamins until after 3-4 months of being pregnant. However, consult with your health care provider before stopping any prescribed iron pills. SEEK MEDICAL CARE IF:   Your abdominal pain increases.  You have a severe headache.  You have vision problems.  You are losing weight. SEEK IMMEDIATE MEDICAL CARE IF:   You are unable to keep fluids down.  You vomit blood.  You have constant nausea and vomiting.  You have excessive weakness.  You have extreme thirst.  You have dizziness or fainting.  You have a fever or persistent symptoms for more than 2-3 days.  You have a fever and your symptoms suddenly get worse. MAKE SURE YOU:   Understand these  instructions.  Will watch your condition.  Will get help right away if you are not doing well or get worse.   This information is not intended to replace advice given to you by your health care provider. Make sure you discuss any questions you have with your health care provider.   Document Released: 07/11/2005 Document Revised: 05/01/2013 Document Reviewed: 02/20/2013 Elsevier Interactive Patient Education 2016 [employer].  Eating Plan for Hyperemesis Gravidarum Severe cases of hyperemesis gravidarum can lead to dehydration and malnutrition. The hyperemesis eating plan is one way to lessen the symptoms of nausea and vomiting. It is often used with prescribed medicines to control your symptoms.  WHAT CAN I DO TO RELIEVE MY SYMPTOMS? Listen to your body. Everyone is different and has different preferences. Find what works best for you. Some of the following things  may help:  Eat and drink slowly.  Eat 5-6 small meals daily instead of 3 large meals.   Eat crackers before you get out of bed in the morning.   Starchy foods are usually well tolerated (such as cereal, toast, bread, potatoes, pasta, rice, and pretzels).   Ginger may help with nausea. Add  tsp ground ginger to hot tea or choose ginger tea.   Try drinking 100% fruit juice or an electrolyte drink.  Continue to take your prenatal vitamins as directed by your health care provider. If you are having trouble taking your prenatal vitamins, talk with your health care provider about different options.  Include at least 1 serving of protein with your meals and snacks (such as meats or poultry, beans, nuts, eggs, or yogurt). Try eating a protein-rich snack before bed (such as cheese and crackers or a half [place] or peanut butter sandwich). WHAT THINGS SHOULD I AVOID TO REDUCE MY SYMPTOMS? The following things may help reduce your symptoms:  Avoid foods with strong smells. Try eating meals in well-ventilated areas that are free  of odors.  Avoid drinking water or other beverages with meals. Try not to drink anything less than 30 minutes before and after meals.  Avoid drinking more than 1 cup of fluid at a time.  Avoid fried or high-fat foods, such as butter and cream sauces.  Avoid spicy foods.  Avoid skipping meals the best you can. Nausea can be more intense on an empty stomach. If you cannot tolerate food at that time, do not force it. Try sucking on ice chips or other frozen items and make up the calories later.  Avoid lying down within 2 hours after eating.   This information is not intended to replace advice given to you by your health care provider. Make sure you discuss any questions you have with your health care provider.   Document Released: 05/08/2007 Document Revised: 07/16/2013 Document Reviewed: 05/15/2013 Elsevier Interactive Patient Education [employer]

## 2015-09-14 ENCOUNTER — Telehealth: Payer: Self-pay | Admitting: Pharmacist

## 2015-09-14 NOTE — Telephone Encounter (Signed)
Pt urine cx growing 50,000 colonies of group b strep. Pt is pregnant. Pt calling informed of results. Requested rx be called into [facility] of [address] in [place]. Pt informed of medication and how to take. Amoxicillin  bid x 7 days called into [facility] ([phone]) [address]. Rx message left on voicemail. Rx authroized by Dr. Fanny Bien.  Olene Floss, Pharm.D Clinical Pharmacist

## 2015-09-15 LAB — URINE CULTURE: Culture: 50000

## 2016-06-25 ENCOUNTER — Emergency Department
Admission: EM | Admit: 2016-06-25 | Discharge: 2016-06-25 | Disposition: A | Discharge status: Home / Self Care | Payer: Self-pay | Attending: Emergency Medicine | Admitting: Emergency Medicine

## 2016-06-25 DIAGNOSIS — Y999 Unspecified external cause status: Secondary | ICD-10-CM | POA: Insufficient documentation

## 2016-06-25 DIAGNOSIS — Y939 Activity, unspecified: Secondary | ICD-10-CM | POA: Insufficient documentation

## 2016-06-25 DIAGNOSIS — L03213 Periorbital cellulitis: Secondary | ICD-10-CM

## 2016-06-25 DIAGNOSIS — X58XXXA Exposure to other specified factors, initial encounter: Secondary | ICD-10-CM | POA: Insufficient documentation

## 2016-06-25 DIAGNOSIS — S0502XA Injury of conjunctiva and corneal abrasion without foreign body, left eye, initial encounter: Secondary | ICD-10-CM | POA: Insufficient documentation

## 2016-06-25 DIAGNOSIS — Y929 Unspecified place or not applicable: Secondary | ICD-10-CM | POA: Insufficient documentation

## 2016-06-25 DIAGNOSIS — Z87891 Personal history of nicotine dependence: Secondary | ICD-10-CM | POA: Insufficient documentation

## 2016-06-25 DIAGNOSIS — H05012 Cellulitis of left orbit: Secondary | ICD-10-CM | POA: Insufficient documentation

## 2016-06-25 MED ORDER — KETOROLAC TROMETHAMINE 60 MG/2ML IM SOLN
60.0000 mg | Freq: Once | INTRAMUSCULAR | Status: AC
  Administered 2016-06-25: 60 mg via INTRAMUSCULAR
  Filled 2016-06-25: qty 2

## 2016-06-25 MED ORDER — FLUORESCEIN SODIUM 1 MG OP STRP
1.0000 | ORAL_STRIP | Freq: Once | OPHTHALMIC | Status: AC
  Administered 2016-06-25: 1 via OPHTHALMIC
  Filled 2016-06-25: qty 1

## 2016-06-25 MED ORDER — SULFAMETHOXAZOLE-TRIMETHOPRIM 800-160 MG PO TABS: 1.0000 | ORAL_TABLET | Freq: Two times a day (BID) | ORAL | 0 refills | Status: DC

## 2016-06-25 MED ORDER — NAPROXEN 500 MG PO TBEC: 500.0000 mg | DELAYED_RELEASE_TABLET | Freq: Two times a day (BID) | ORAL | 0 refills | Status: AC

## 2016-06-25 MED ORDER — CIPROFLOXACIN HCL 0.3 % OP SOLN: 1.0000 [drp] | Freq: Four times a day (QID) | OPHTHALMIC | 0 refills | Status: AC

## 2016-06-25 NOTE — ED Provider Notes (Signed)
[facility] Emergency Department Provider Note ____________________________________________  Time seen: Approximately 2:56 PM  I have reviewed the triage vital signs and the nursing notes.   HISTORY  Chief Complaint Conjunctivitis   HPI Madeline Montgomery is a 35 y.o. female presenting to the emergency department with left eye pain and erythema that started yesterday. Left eye has been watering and crusting. Patient has never experienced symptoms like this in the past. She has not had contact with a person known to have conjunctivitis. Patient wears contact lenses. She denies sleeping in her contacts or wearing them longer than advised. She has blurry vision and photophobia. She states that when the pain started, her eye began to feel irritated at work. When she looked in the mirror, she noticed left eye was mildly erythematous. Erythema has since worsened significantly. Today, she also had erythema and edema of the eyelid and skin overlying the inferior left orbit. She denies fever and chills. She denies a history of diabetes. Patient states that left eye is painful. However, pain is not increased with extraocular eye muscle use. She currently rates pain at 3/10 in intensity. She has not attempted alleviating measures.   History reviewed. No pertinent past medical history.  There are no active problems to display for this patient.   History reviewed. No pertinent surgical history.  Prior to Admission medications   Medication Sig Start Date End Date Taking? Authorizing Provider  ciprofloxacin (CILOXAN) 0.3 % ophthalmic solution Place 1 drop into the left eye 4 (four) times daily. Administer 1 drop, every 2 hours, while awake, for 2 days. Then 1 drop, every 4 hours, while awake, for the next 5 days. 06/25/16 07/05/16  Orvil FeilJaclyn M Creig Landin, PA-C  metoCLOPramide (REGLAN) 10 MG tablet Take 1 tablet (10 mg total) by mouth every 8 (eight) hours as needed for nausea or vomiting.  09/13/15 09/12/16  Rebecka ApleyAllison P Webster, MD  naproxen (EC NAPROSYN) 500 MG EC tablet Take 1 tablet (500 mg total) by mouth 2 (two) times daily with a meal. 06/25/16 06/25/17  Orvil FeilJaclyn M Bettey Muraoka, PA-C  sulfamethoxazole-trimethoprim (BACTRIM DS) 800-160 MG tablet Take 1 tablet by mouth 2 (two) times daily. 06/25/16   Orvil FeilJaclyn M Caryle Helgeson, PA-C    Allergies Amoxicillin  No family history on file.  Social History Social History  Substance Use Topics  . Smoking status: Former Smoker    Packs/day: 0.00    Types: Cigarettes  . Smokeless tobacco: Never Used  . Alcohol use Yes     Comment: OCCAS    Review of Systems   Constitutional: No fever/chills Eyes:Blurry vision and photophobia, left. Left eye pain.  Musculoskeletal: Negative for pain. Skin: Negative for rash. Neurological: Negative for headaches, focal weakness or numbness. Allergic: Negative for seasonal allergies.  ____________________________________________  PHYSICAL EXAM:  VITAL SIGNS: ED Triage Vitals  Enc Vitals Group     BP 06/25/16 1445 120/72     Pulse Rate 06/25/16 1445 81     Resp 06/25/16 1445 14     Temp 06/25/16 1445 98.4 F (36.9 C)     Temp Source 06/25/16 1445 Oral     SpO2 06/25/16 1445 96 %     Weight 06/25/16 1443 220 lb (99.8 kg)     Height 06/25/16 1443 5\' 7"  (1.702 m)     Head Circumference --      Peak Flow --      Pain Score --      Pain Loc --  Pain Edu? --      Excl. in GC? --     Constitutional: Alert and oriented. Well appearing and in no acute distress. Eyes: Visual acuity is 20/20 right and 20/40 left. Patient's palpebra superior and palpebra inferior are edematous and erythematous.  Patient's extraocular eye muscles are intact bilaterally without increased pain with testing. No ptosis visualized bilaterally. Left eye: On fluorescein stain testing, significant uptake is visualized between the pupil and the lateral canthus. The sclera is significantly erythematous with perilimbal redness  visualized. Pupils are equal round and reactive to light bilaterally. Patient's palpebra conjunctiva is erythematous only on the left. Cranial nerves II through XII are intact. Head: Atraumatic. Nose: No congestion/rhinnorhea. Mouth/Throat: Mucous membranes are moist.  Oropharynx non-erythematous. Respiratory: CTAB Neurologic:  Normal speech and language. No gross focal neurologic deficits are appreciated. Speech is normal. No gait instability. Psychiatric: Mood and affect are normal. Speech and behavior are normal.  ____________________________________________   LABS (all labs ordered are listed, but only abnormal results are displayed)  Labs Reviewed - No data to display    PROCEDURES  Procedure(s) performed:  Toradol  ____________________________________________   INITIAL IMPRESSION / ASSESSMENT AND PLAN / ED COURSE  Pertinent labs & imaging results that were available during my care of the patient were reviewed by me and considered in my medical decision making (see chart for details).  Clinical Course    Assessment and plan: Differential diagnosis originally included bacterial conjunctivitis, preseptal cellulitis, uveitis and corneal abrasion. With blurry vision and photophobia, I do not suspect conjunctivitis alone. I suspect that patient has an early preseptal cellulitis with a uveitis secondary to a corneal abrasion. Patient was started on ciprofloxacin ophthalmic solution and Bactrim. She was referred to the ophthalmologist on-call, Dr. Brooke DareKing with specific instructions to make an appointment for Monday. Patient education was provided regarding the importance of seeking care with ophthalmology to avoid possible vision loss. Patient was also prescribed naproxen as needed for eye pain. Patient is currently afebrile. Vital signs are reassuring at this time. All patient questions were answered. Patient denies pregnancy testing at this  time.   ____________________________________________   FINAL CLINICAL IMPRESSION(S) / ED DIAGNOSES  Final diagnoses:  Preseptal cellulitis  Abrasion of left cornea, initial encounter    Note:  This document was prepared using Dragon voice recognition software and may include unintentional dictation errors.    Orvil FeilJaclyn M Znya Albino, PA-C 06/25/16 1719    Sharman CheekPhillip Stafford, MD 06/25/16 2116

## 2016-06-25 NOTE — ED Triage Notes (Signed)
Pt c/o left eye pain. Pt left eye is red and draining. Started yesterday.

## 2020-07-16 ENCOUNTER — Emergency Department: Payer: Self-pay

## 2020-07-16 ENCOUNTER — Telehealth: Payer: Self-pay | Admitting: Emergency Medicine

## 2020-07-16 ENCOUNTER — Other Ambulatory Visit: Payer: Self-pay

## 2020-07-16 ENCOUNTER — Encounter: Payer: Self-pay | Admitting: Intensive Care

## 2020-07-16 ENCOUNTER — Emergency Department
Admission: EM | Admit: 2020-07-16 | Discharge: 2020-07-16 | Disposition: A | Discharge status: Home / Self Care | Payer: Self-pay | Attending: Emergency Medicine | Admitting: Emergency Medicine

## 2020-07-16 DIAGNOSIS — Z87891 Personal history of nicotine dependence: Secondary | ICD-10-CM | POA: Insufficient documentation

## 2020-07-16 DIAGNOSIS — E860 Dehydration: Secondary | ICD-10-CM | POA: Insufficient documentation

## 2020-07-16 DIAGNOSIS — G43009 Migraine without aura, not intractable, without status migrainosus: Secondary | ICD-10-CM | POA: Insufficient documentation

## 2020-07-16 LAB — BASIC METABOLIC PANEL
Anion gap: 8 (ref 5–15)
BUN: 12 mg/dL (ref 6–20)
CO2: 23 mmol/L (ref 22–32)
Calcium: 9 mg/dL (ref 8.9–10.3)
Chloride: 107 mmol/L (ref 98–111)
Creatinine, Ser: 0.74 mg/dL (ref 0.44–1.00)
GFR, Estimated: >60 mL/min (ref >60)
Glucose, Bld: 90 mg/dL (ref 70–99)
Potassium: 4.2 mmol/L (ref 3.5–5.1)
Sodium: 138 mmol/L (ref 135–145)

## 2020-07-16 LAB — CBC
HCT: 39.2 % (ref 36.0–46.0)
Hemoglobin: 12.5 g/dL (ref 12.0–15.0)
MCH: 24.6 pg — ABNORMAL LOW (ref 26.0–34.0)
MCHC: 31.9 g/dL (ref 30.0–36.0)
MCV: 77 fL — ABNORMAL LOW (ref 80.0–100.0)
Platelets: 361 10*3/uL (ref 150–400)
RBC: 5.09 MIL/uL (ref 3.87–5.11)
RDW: 14.7 % (ref 11.5–15.5)
WBC: 6.6 10*3/uL (ref 4.0–10.5)
nRBC: 0 % (ref 0.0–0.2)

## 2020-07-16 LAB — URINALYSIS, COMPLETE (UACMP) WITH MICROSCOPIC
Bacteria, UA: NONE SEEN
Bilirubin Urine: NEGATIVE
Glucose, UA: NEGATIVE mg/dL
Hgb urine dipstick: NEGATIVE
Ketones, ur: NEGATIVE mg/dL
Nitrite: NEGATIVE
Protein, ur: 30 mg/dL — AB
Specific Gravity, Urine: 1.013 (ref 1.005–1.030)
WBC, UA: >50 WBC/hpf — ABNORMAL HIGH (ref 0–5)
pH: 5 (ref 5.0–8.0)

## 2020-07-16 LAB — POC URINE PREG, ED: Preg Test, Ur: NEGATIVE

## 2020-07-16 MED ORDER — BUTALBITAL-APAP-CAFFEINE 50-325-40 MG PO TABS: 1.0000 | ORAL_TABLET | Freq: Four times a day (QID) | ORAL | 0 refills | Status: AC | PRN

## 2020-07-16 MED ORDER — DIPHENHYDRAMINE HCL 50 MG/ML IJ SOLN
25.0000 mg | Freq: Once | INTRAMUSCULAR | Status: AC
  Administered 2020-07-16: 19:00:00 25 mg via INTRAVENOUS
  Filled 2020-07-16: qty 1

## 2020-07-16 MED ORDER — SODIUM CHLORIDE 0.9 % IV BOLUS
1000.0000 mL | Freq: Once | INTRAVENOUS | Status: AC
  Administered 2020-07-16: 19:00:00 1000 mL via INTRAVENOUS

## 2020-07-16 MED ORDER — DEXAMETHASONE SODIUM PHOSPHATE 10 MG/ML IJ SOLN
10.0000 mg | Freq: Once | INTRAMUSCULAR | Status: AC
  Administered 2020-07-16: 20:00:00 10 mg via INTRAVENOUS
  Filled 2020-07-16: qty 1

## 2020-07-16 MED ORDER — PROCHLORPERAZINE EDISYLATE 10 MG/2ML IJ SOLN
10.0000 mg | Freq: Once | INTRAMUSCULAR | Status: AC
  Administered 2020-07-16: 19:00:00 10 mg via INTRAVENOUS
  Filled 2020-07-16: qty 2

## 2020-07-16 MED ORDER — HALOPERIDOL LACTATE 5 MG/ML IJ SOLN
2.0000 mg | Freq: Once | INTRAMUSCULAR | Status: AC
  Administered 2020-07-16: 20:00:00 2 mg via INTRAVENOUS
  Filled 2020-07-16: qty 1

## 2020-07-16 MED ORDER — KETOROLAC TROMETHAMINE 30 MG/ML IJ SOLN
15.0000 mg | Freq: Once | INTRAMUSCULAR | Status: AC
  Administered 2020-07-16: 19:00:00 15 mg via INTRAVENOUS
  Filled 2020-07-16: qty 1

## 2020-07-16 NOTE — Telephone Encounter (Signed)
Urinalysis was sent at discharge. Patient was not here for UTI symptoms and she had no dysuria, frequency, urgency, or other signs of infection. No flank pain or evidence of pyelo. Per IDSA guidelines, suspect asymptomatic pyuria and antibiotics not indicated at this time. This encounter was created to add a urine culture order. Lab notified.

## 2020-07-16 NOTE — ED Triage Notes (Signed)
Pt c/o dizziness/lightheadedness and headache X1 week ago. Denies LOC. Reports some nausea

## 2020-07-16 NOTE — ED Notes (Signed)
Reviewed AVS with pt and pt verbalized understanding. Pt denied any questions. Signature pad broken at this time.

## 2020-07-16 NOTE — ED Provider Notes (Signed)
[facility] Emergency Department Provider Note  ____________________________________________   Event Date/Time   First MD Initiated Contact with Patient 07/16/20 [phone]     (approximate)  I have reviewed the triage vital signs and the nursing notes.   HISTORY  Chief Complaint Dizziness and Headache    HPI Madeline Montgomery is a 39 y.o. female  Here with headache, lightheadedness.  Pt reports that over the past week, she has been working more than usual at [employer]. She is on her feet often. Over the last several days, she has noticed lightheadedness and a dizziness sensation while standing, along with an aching, throbbing, stabbing headache. These sx seem to relate to when she is standing for long periods of time and improve when resting and elevating her legs.She has a h/o headaches but these HA are more persistent and severe than usual. No thunderclap onset. No fevers, chills. No known sick contacts. She states she has been trying to eat/drink as much as possible. No recent med changes. No neck pain or stiffness. No numbness or weakness.     History reviewed. No pertinent past medical history.  There are no problems to display for this patient.   History reviewed. No pertinent surgical history.  Prior to Admission medications   Medication Sig Start Date End Date Taking? Authorizing Provider  butalbital-acetaminophen-caffeine (FIORICET) 50-325-40 MG tablet Take 1 tablet by mouth every 6 (six) hours as needed for headache. 07/16/20 07/16/21  Shaune Pollack, MD  metoCLOPramide (REGLAN) 10 MG tablet Take 1 tablet (10 mg total) by mouth every 8 (eight) hours as needed for nausea or vomiting. 09/13/15 09/12/16  Rebecka Apley, MD  sulfamethoxazole-trimethoprim (BACTRIM DS) 800-160 MG tablet Take 1 tablet by mouth 2 (two) times daily. 06/25/16   Orvil Feil, PA-C    Allergies Amoxicillin and Penicillins  History reviewed. No pertinent family  history.  Social History Social History   Tobacco Use  . Smoking status: Former Smoker    Packs/day: 0.00    Types: Cigarettes  . Smokeless tobacco: Never Used  Substance Use Topics  . Alcohol use: Yes    Comment: OCCAS  . Drug use: No    Review of Systems  Review of Systems  Constitutional: Positive for fatigue. Negative for chills and fever.  HENT: Negative for sore throat.   Respiratory: Negative for shortness of breath.   Cardiovascular: Negative for chest pain.  Gastrointestinal: Negative for abdominal pain.  Genitourinary: Negative for flank pain.  Musculoskeletal: Negative for neck pain.  Skin: Negative for rash and wound.  Allergic/Immunologic: Negative for immunocompromised state.  Neurological: Positive for weakness, light-headedness and headaches. Negative for numbness.  Hematological: Does not bruise/bleed easily.  All other systems reviewed and are negative.    ____________________________________________  PHYSICAL EXAM:      VITAL SIGNS: ED Triage Vitals [07/16/20 1329]  Enc Vitals Group     BP 125/76     Pulse Rate 85     Resp 16     Temp 99.1 F (37.3 C)     Temp Source Oral     SpO2 100 %     Weight 250 lb (113.4 kg)     Height 5\' 7"  (1.702 m)     Head Circumference      Peak Flow      Pain Score 10     Pain Loc      Pain Edu?      Excl. in GC?  Physical Exam Vitals and nursing note reviewed.  Constitutional:      General: She is not in acute distress.    Appearance: She is well-developed.  HENT:     Head: Normocephalic and atraumatic.     Mouth/Throat:     Comments: Mildly dry Eyes:     Conjunctiva/sclera: Conjunctivae normal.  Cardiovascular:     Rate and Rhythm: Normal rate and regular rhythm.     Heart sounds: Normal heart sounds. No murmur heard. No friction rub.  Pulmonary:     Effort: Pulmonary effort is normal. No respiratory distress.     Breath sounds: Normal breath sounds. No wheezing or rales.  Abdominal:      General: There is no distension.     Palpations: Abdomen is soft.     Tenderness: There is no abdominal tenderness.  Musculoskeletal:     Cervical back: Neck supple.  Skin:    General: Skin is warm.     Capillary Refill: Capillary refill takes less than 2 seconds.  Neurological:     Mental Status: She is alert and oriented to person, place, and time.     Motor: No abnormal muscle tone.     Comments: Neurological Exam:  Mental Status: Alert and oriented to person, place, and time. Attention and concentration normal. Speech clear. Recent memory is intact. Cranial Nerves: Visual fields grossly intact. EOMI and PERRLA. No nystagmus noted. No facial asymmetry or weakness. Hearing grossly normal. Uvula is midline, and palate elevates symmetrically. Tongue midline without fasciculations. Motor: Muscle strength 5/5 in bilateral UE and LE. No pronator drift. Muscle tone normal. Sensation: Intact to light touch in upper and lower extremities distally bilaterally.  Gait: Normal without ataxia. Coordination: Normal FTN bilaterally.          ____________________________________________   LABS (all labs ordered are listed, but only abnormal results are displayed)  Labs Reviewed  CBC - Abnormal; Notable for the following components:      Result Value   MCV 77.0 (*)    MCH 24.6 (*)    All other components within normal limits  URINALYSIS, COMPLETE (UACMP) WITH MICROSCOPIC - Abnormal; Notable for the following components:   Color, Urine YELLOW (*)    APPearance CLOUDY (*)    Protein, ur 30 (*)    Leukocytes,Ua LARGE (*)    WBC, UA >50 (*)    All other components within normal limits  BASIC METABOLIC PANEL  POC URINE PREG, ED  CBG MONITORING, ED    ____________________________________________  EKG: Normal sinus rhythm, VR 84. PR 158, QRS 82, QTc 420. No acute St elevation or depression. No ischemia or infarct. ________________________________________  RADIOLOGY All imaging,  including plain films, CT scans, and ultrasounds, independently reviewed by me, and interpretations confirmed via formal radiology reads.  ED MD interpretation:   CT Head: NAICA  Official radiology report(s): CT Head Wo Contrast  Result Date: 07/16/2020 CLINICAL DATA:  Dizziness, lightheadedness, headache for 1 week EXAM: CT HEAD WITHOUT CONTRAST TECHNIQUE: Contiguous axial images were obtained from the base of the skull through the vertex without intravenous contrast. COMPARISON:  None. FINDINGS: Brain: No acute infarct or hemorrhage. Lateral ventricles and midline structures are unremarkable. No acute extra-axial fluid collections. No mass effect. Vascular: No hyperdense vessel or unexpected calcification. Skull: Normal. Negative for fracture or focal lesion. Sinuses/Orbits: No acute finding. Other: None. IMPRESSION: 1. No acute intracranial process. Electronically Signed   By: Sharlet Salina M.D.   On: 07/16/2020 19:31  ____________________________________________  PROCEDURES   Procedure(s) performed (including Critical Care):  Procedures  ____________________________________________  INITIAL IMPRESSION / MDM / ASSESSMENT AND PLAN / ED COURSE  As part of my medical decision making, I reviewed the following data within the electronic MEDICAL RECORD NUMBER Nursing notes reviewed and incorporated, Old chart reviewed, Notes from prior ED visits, and [place] Controlled Substance Database       *Madeline Montgomery was evaluated in Emergency Department on 07/16/2020 for the symptoms described in the history of present illness. She was evaluated in the context of the global COVID-19 pandemic, which necessitated consideration that the patient might be at risk for infection with the SARS-CoV-2 virus that causes COVID-19. Institutional protocols and algorithms that pertain to the evaluation of patients at risk for COVID-19 are in a state of rapid change based on information released by regulatory  bodies including the CDC and federal and state organizations. These policies and algorithms were followed during the patient's care in the ED.  Some ED evaluations and interventions may be delayed as a result of limited staffing during the pandemic.*     Medical Decision Making:  39 yo F here with lightheadedness, dizziness while standing and throbbing headache. Suspect orthostasis related to dehydration and prolonged standing, as well as migraine vs tension-type primary headache. CBC is without anemia or abnormality. BMP unremarkable. EKG nonischemic with no arrhythmia. CT head reviewed, shows NAICA. She has no focal neurological deficits. HA is mild and is not concerning for [facility]. No fever, chills, or signs of meningitis or encephalitis.  Pt given fluids, migraine meds with marked improvement. Will encourage hydration and regular rest/leg elevation at work, give brief course of analgesia for headaches.  Of note, UA sent prior to discharge returned with pyuria. No bacteria. She denies any dysuria, urgency, frequency. UCx added on. Clinically, no signs of UTI so will hold on ABX per IDSA recommendations.  ____________________________________________  FINAL CLINICAL IMPRESSION(S) / ED DIAGNOSES  Final diagnoses:  Dehydration  Migraine without aura and without status migrainosus, not intractable     MEDICATIONS GIVEN DURING THIS VISIT:  Medications  sodium chloride 0.9 % bolus 1,000 mL (0 mLs Intravenous Stopped 07/16/20 2036)  prochlorperazine (COMPAZINE) injection 10 mg (10 mg Intravenous Given 07/16/20 1915)  diphenhydrAMINE (BENADRYL) injection 25 mg (25 mg Intravenous Given 07/16/20 1915)  ketorolac (TORADOL) 30 MG/ML injection 15 mg (15 mg Intravenous Given 07/16/20 1915)  haloperidol lactate (HALDOL) injection 2 mg (2 mg Intravenous Given 07/16/20 2012)  dexamethasone (DECADRON) injection 10 mg (10 mg Intravenous Given 07/16/20 2012)     ED Discharge Orders         Ordered     butalbital-acetaminophen-caffeine (FIORICET) 50-325-40 MG tablet  Every 6 hours PRN        07/16/20 2027           Note:  This document was prepared using Dragon voice recognition software and may include unintentional dictation errors.   Shaune Pollack, MD 07/16/20 2320

## 2020-07-16 NOTE — ED Notes (Signed)
Pt reports HA with associated dizziness and nausea x 1 week. Pt endorses light sensitivity. Pt denies hx of migraine. Pt states pain is intermittent and is worse after being standing for a long time. Neuro exam WNL. Pt in NAD at this time. VSS. Awaiting further orders. Will continue to monitor.

## 2020-07-22 ENCOUNTER — Encounter: Payer: Self-pay | Admitting: *Deleted

## 2020-07-22 ENCOUNTER — Other Ambulatory Visit: Payer: Self-pay

## 2020-07-22 ENCOUNTER — Emergency Department
Admission: EM | Admit: 2020-07-22 | Discharge: 2020-07-22 | Disposition: A | Discharge status: Home / Self Care | Payer: HRSA Program | Attending: Emergency Medicine | Admitting: Emergency Medicine

## 2020-07-22 DIAGNOSIS — U071 COVID-19: Secondary | ICD-10-CM | POA: Diagnosis not present

## 2020-07-22 DIAGNOSIS — R42 Dizziness and giddiness: Secondary | ICD-10-CM | POA: Insufficient documentation

## 2020-07-22 DIAGNOSIS — Z87891 Personal history of nicotine dependence: Secondary | ICD-10-CM | POA: Insufficient documentation

## 2020-07-22 DIAGNOSIS — J029 Acute pharyngitis, unspecified: Secondary | ICD-10-CM | POA: Diagnosis present

## 2020-07-22 DIAGNOSIS — N3000 Acute cystitis without hematuria: Secondary | ICD-10-CM | POA: Diagnosis not present

## 2020-07-22 LAB — CBC
HCT: 38.6 % (ref 36.0–46.0)
Hemoglobin: 12.7 g/dL (ref 12.0–15.0)
MCH: 25.1 pg — ABNORMAL LOW (ref 26.0–34.0)
MCHC: 32.9 g/dL (ref 30.0–36.0)
MCV: 76.4 fL — ABNORMAL LOW (ref 80.0–100.0)
Platelets: 360 10*3/uL (ref 150–400)
RBC: 5.05 MIL/uL (ref 3.87–5.11)
RDW: 15 % (ref 11.5–15.5)
WBC: 7 10*3/uL (ref 4.0–10.5)
nRBC: 0 % (ref 0.0–0.2)

## 2020-07-22 LAB — URINALYSIS, COMPLETE (UACMP) WITH MICROSCOPIC
Bacteria, UA: NONE SEEN
Bilirubin Urine: NEGATIVE
Glucose, UA: NEGATIVE mg/dL
Ketones, ur: NEGATIVE mg/dL
Nitrite: NEGATIVE
Protein, ur: 30 mg/dL — AB
Specific Gravity, Urine: 1.018 (ref 1.005–1.030)
WBC, UA: >50 WBC/hpf — ABNORMAL HIGH (ref 0–5)
pH: 7 (ref 5.0–8.0)

## 2020-07-22 LAB — BASIC METABOLIC PANEL
Anion gap: 10 (ref 5–15)
BUN: 8 mg/dL (ref 6–20)
CO2: 25 mmol/L (ref 22–32)
Calcium: 9.1 mg/dL (ref 8.9–10.3)
Chloride: 102 mmol/L (ref 98–111)
Creatinine, Ser: 0.93 mg/dL (ref 0.44–1.00)
GFR, Estimated: >60 mL/min (ref >60)
Glucose, Bld: 106 mg/dL — ABNORMAL HIGH (ref 70–99)
Potassium: 4.2 mmol/L (ref 3.5–5.1)
Sodium: 137 mmol/L (ref 135–145)

## 2020-07-22 LAB — RESP PANEL BY RT-PCR (FLU A&B, COVID) ARPGX2
Influenza A by PCR: NEGATIVE
Influenza B by PCR: NEGATIVE
SARS Coronavirus 2 by RT PCR: POSITIVE — AB

## 2020-07-22 LAB — POC URINE PREG, ED: Preg Test, Ur: NEGATIVE

## 2020-07-22 MED ORDER — CEPHALEXIN 500 MG PO CAPS: 500.0000 mg | ORAL_CAPSULE | Freq: Two times a day (BID) | ORAL | 0 refills | Status: AC

## 2020-07-22 MED ORDER — ONDANSETRON 4 MG PO TBDP: 4.0000 mg | ORAL_TABLET | Freq: Three times a day (TID) | ORAL | 0 refills | Status: AC | PRN

## 2020-07-22 NOTE — ED Provider Notes (Signed)
[facility] Emergency Department Provider Note ____________________________________________   Event Date/Time   First MD Initiated Contact with Patient 07/22/20 [phone]     (approximate)  I have reviewed the triage vital signs and the nursing notes.   HISTORY  Chief Complaint Dizziness and Nausea  HPI Madeline Montgomery is a 39 y.o. female with no chronic medical history presents to the emergency department for treatment and evaluation of dizziness and nausea with a sore throat. She has had COVID exposure. She denies fever. She has also had some urinary urgency and frequency.      History reviewed. No pertinent past medical history.  There are no problems to display for this patient.   History reviewed. No pertinent surgical history.  Prior to Admission medications   Medication Sig Start Date End Date Taking? Authorizing Provider  cephALEXin (KEFLEX) 500 MG capsule Take 1 capsule (500 mg total) by mouth 2 (two) times daily for 7 days. 07/22/20 07/29/20 Yes Taylen Wendland B, FNP  ondansetron (ZOFRAN-ODT) 4 MG disintegrating tablet Take 1 tablet (4 mg total) by mouth every 8 (eight) hours as needed for nausea or vomiting. 07/22/20  Yes Jhovani Griswold B, FNP  butalbital-acetaminophen-caffeine (FIORICET) 50-325-40 MG tablet Take 1 tablet by mouth every 6 (six) hours as needed for headache. 07/16/20 07/16/21  Shaune Pollack, MD  metoCLOPramide (REGLAN) 10 MG tablet Take 1 tablet (10 mg total) by mouth every 8 (eight) hours as needed for nausea or vomiting. 09/13/15 09/12/16  Rebecka Apley, MD    Allergies Amoxicillin and Penicillins  History reviewed. No pertinent family history.  Social History Social History   Tobacco Use  . Smoking status: Former Smoker    Packs/day: 0.00    Types: Cigarettes  . Smokeless tobacco: Never Used  Substance Use Topics  . Alcohol use: Yes    Comment: OCCAS  . Drug use: No    Review of Systems  Constitutional: No  fever/chills Eyes: No visual changes. ENT: No sore throat. Cardiovascular: Denies chest pain. Respiratory: Denies shortness of breath. Gastrointestinal: No abdominal pain.  No nausea, no vomiting.  No diarrhea.  No constipation. Genitourinary: Negative for dysuria. Positive for urinary frequency. Musculoskeletal: Negative for back pain. Skin: Negative for rash. Neurological: Negative for headaches, focal weakness or numbness. ____________________________________________   PHYSICAL EXAM:  VITAL SIGNS: ED Triage Vitals  Enc Vitals Group     BP 07/22/20 0424 126/78     Pulse Rate 07/22/20 0424 100     Resp 07/22/20 0424 16     Temp 07/22/20 0424 (!) 100.4 F (38 C)     Temp Source 07/22/20 0424 Oral     SpO2 07/22/20 0424 98 %     Weight 07/22/20 0425 250 lb (113.4 kg)     Height 07/22/20 0425 5\' 7"  (1.702 m)     Head Circumference --      Peak Flow --      Pain Score 07/22/20 0425 10     Pain Loc --      Pain Edu? --      Excl. in GC? --     Constitutional: Alert and oriented. Well appearing and in no acute distress. Eyes: Conjunctivae are normal. PERRL. EOMI. Head: Atraumatic. Nose: No congestion/rhinnorhea. Mouth/Throat: Mucous membranes are moist.  Oropharynx non-erythematous. Neck: No stridor.   Hematological/Lymphatic/Immunilogical: No cervical lymphadenopathy. Cardiovascular: Normal rate, regular rhythm. Grossly normal heart sounds.  Good peripheral circulation. Respiratory: Normal respiratory effort.  No retractions. Lungs CTAB. Gastrointestinal:  Soft and nontender. No distention. No abdominal bruits. No CVA tenderness. Genitourinary:  Musculoskeletal: No lower extremity tenderness nor edema.  No joint effusions. Neurologic:  Normal speech and language. No gross focal neurologic deficits are appreciated. No gait instability. Skin:  Skin is warm, dry and intact. No rash noted. Psychiatric: Mood and affect are normal. Speech and behavior are  normal.  ____________________________________________   LABS (all labs ordered are listed, but only abnormal results are displayed)  Labs Reviewed  RESP PANEL BY RT-PCR (FLU A&B, COVID) ARPGX2 - Abnormal; Notable for the following components:      Result Value   SARS Coronavirus 2 by RT PCR POSITIVE (*)    All other components within normal limits  BASIC METABOLIC PANEL - Abnormal; Notable for the following components:   Glucose, Bld 106 (*)    All other components within normal limits  CBC - Abnormal; Notable for the following components:   MCV 76.4 (*)    MCH 25.1 (*)    All other components within normal limits  URINALYSIS, COMPLETE (UACMP) WITH MICROSCOPIC - Abnormal; Notable for the following components:   Color, Urine YELLOW (*)    APPearance TURBID (*)    Hgb urine dipstick MODERATE (*)    Protein, ur 30 (*)    Leukocytes,Ua LARGE (*)    WBC, UA >50 (*)    All other components within normal limits  POC URINE PREG, ED   ____________________________________________  EKG  ED ECG REPORT I, Beautiful Pensyl, FNP-BC personally viewed and interpreted this ECG.   Date: 07/22/2020  EKG Time: 0421  Rate: 100  Rhythm: normal EKG, normal sinus rhythm  Axis: normal  Intervals:none  ST&T Change: no ST elevation  ____________________________________________  RADIOLOGY  ED MD interpretation:    Not indicated.   I, Kem Boroughs, personally viewed and evaluated these images (plain radiographs) as part of my medical decision making, as well as reviewing the written report by the radiologist.  Official radiology report(s): No results found.  ____________________________________________   PROCEDURES  Procedure(s) performed (including Critical Care):  Procedures  ____________________________________________   INITIAL IMPRESSION / ASSESSMENT AND PLAN     39 year old female presenting to the emergency department for treatment and evaluation of symptoms as  described in the HPI.  Plan will be to review labs drawn while awaiting ER room assignment.  DIFFERENTIAL DIAGNOSIS  COVID-19, vertigo,  ED COURSE  Covid positive.  Urinalysis consistent with acute cystitis.  This may be contributing to her dizziness.  Plan will be to treat her with antibiotic.  She also having a little nausea related to the dizziness so some Zofran will also be submitted to her pharmacy.  Quarantine instructions were discussed.  Work note was provided.  She is to follow-up with a primary care provider return to the emergency department for symptoms of change or worsen or for new concerns.    ___________________________________________   FINAL CLINICAL IMPRESSION(S) / ED DIAGNOSES  Final diagnoses:  COVID  Acute cystitis without hematuria     ED Discharge Orders         Ordered    cephALEXin (KEFLEX) 500 MG capsule  2 times daily        07/22/20 0753    ondansetron (ZOFRAN-ODT) 4 MG disintegrating tablet  Every 8 hours PRN        07/22/20 0754           Madeline Montgomery was evaluated in Emergency Department on 07/22/2020 for the symptoms described  in the history of present illness. She was evaluated in the context of the global COVID-19 pandemic, which necessitated consideration that the patient might be at risk for infection with the SARS-CoV-2 virus that causes COVID-19. Institutional protocols and algorithms that pertain to the evaluation of patients at risk for COVID-19 are in a state of rapid change based on information released by regulatory bodies including the CDC and federal and state organizations. These policies and algorithms were followed during the patient's care in the ED.   Note:  This document was prepared using Dragon voice recognition software and may include unintentional dictation errors.   Chinita Pester, FNP 07/22/20 1600    Jene Every, MD 07/24/20 1214

## 2020-07-22 NOTE — ED Triage Notes (Signed)
Pt to ED for returning dizziness and nausea with a sore throat. Pt was recently seen for dizziness and reports the medication she was prescribed has not been helping. No vomiting or diarrhea. No headache this time. Pt to ED with significant other that has COVID like symptoms.

## 2020-07-22 NOTE — Discharge Instructions (Signed)
Please follow up with primary care or return to the ER for symptoms of concern.  Rotate tylenol and ibuprofen for fever, headache, bodyaches, and sore throat.

## 2020-07-22 NOTE — ED Notes (Signed)
ED Provider at bedside. 

## 2020-08-31 ENCOUNTER — Ambulatory Visit: Payer: Self-pay | Admitting: Adult Health

## 2022-03-26 IMAGING — CT CT HEAD W/O CM
3 series · 15 of 46 positions shown, 18 images · non-contrast
Comparison: None.

CLINICAL DATA: Dizziness, lightheadedness, headache for 1 week

EXAM:
CT HEAD WITHOUT CONTRAST
TECHNIQUE: Contiguous axial images were obtained from the base of the skull
through the vertex without intravenous contrast.

[Series 2: head wo · axial · 0.43mm/px · z∈[+1094,+1214]mm · 9 of 29 slices shown, 12 images]
[im 3/29  brain]
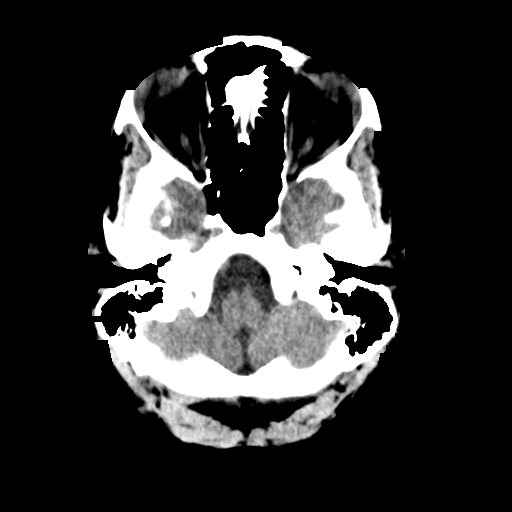
[im 3/29  bone]
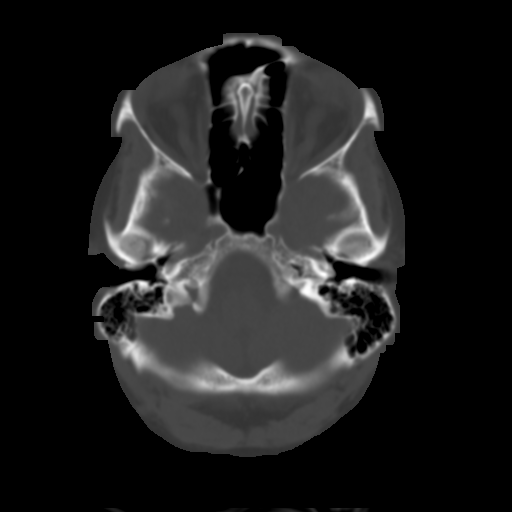
[im 6/29  brain]
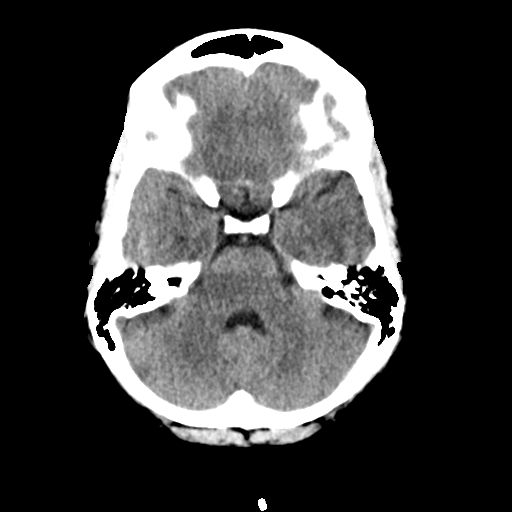
[im 9/29  brain]
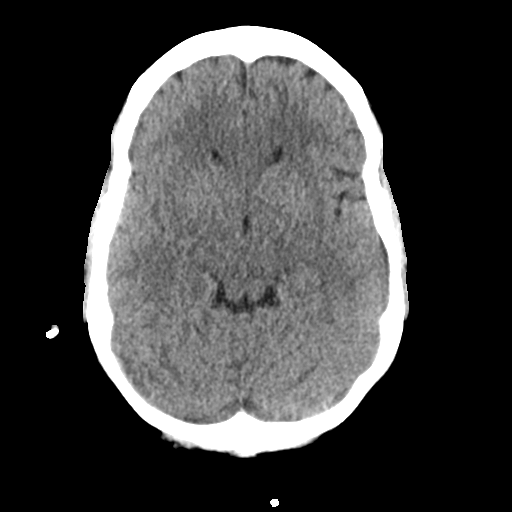
[im 12/29  brain]
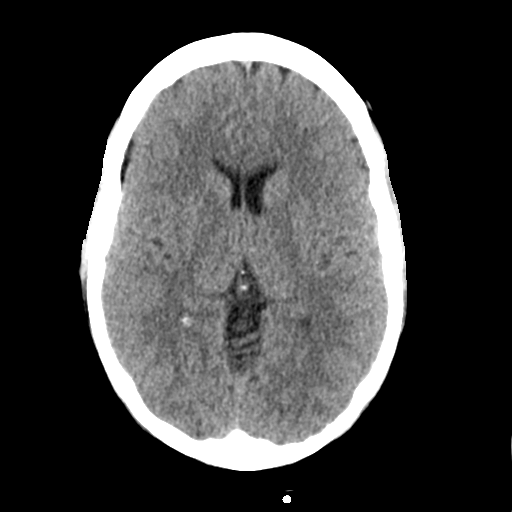
[im 15/29  brain]
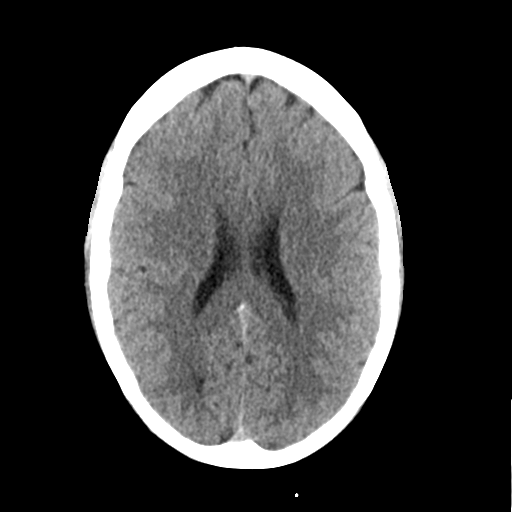
[im 15/29  bone]
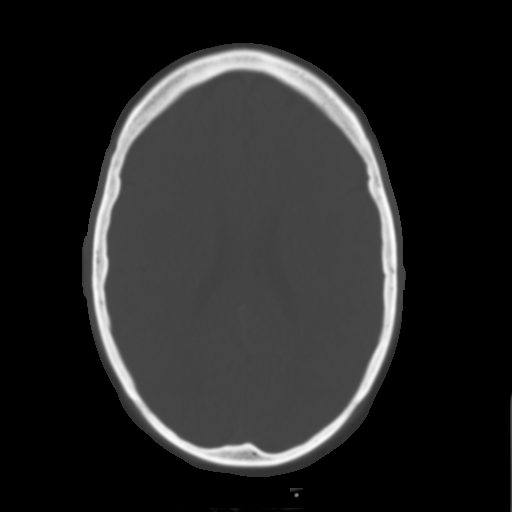
[im 18/29  brain]
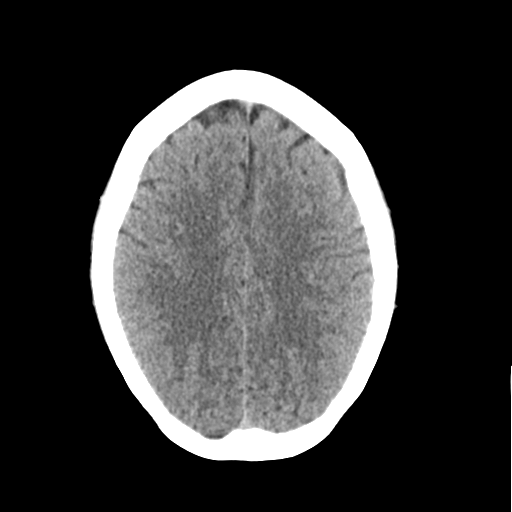
[im 21/29  brain]
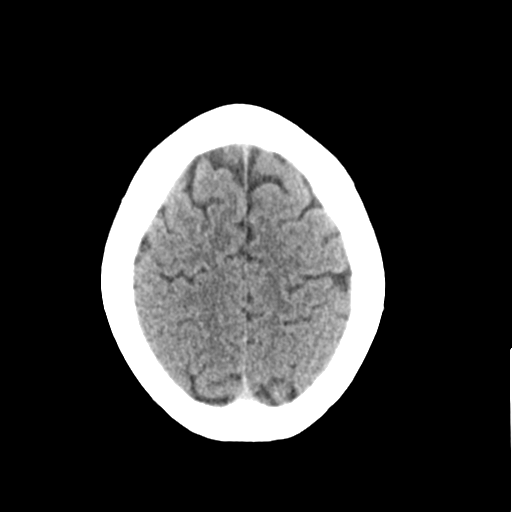
[im 24/29  brain]
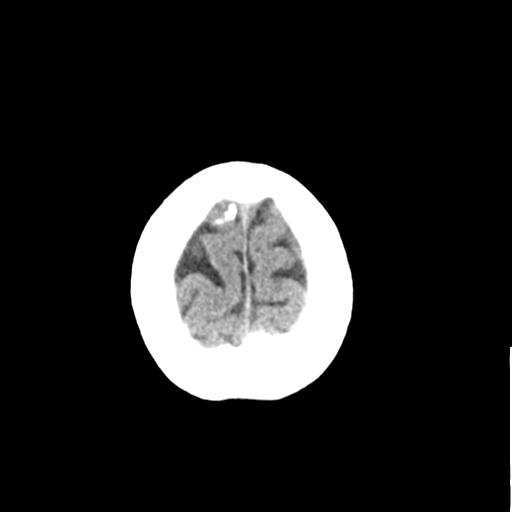
[im 27/29  brain]
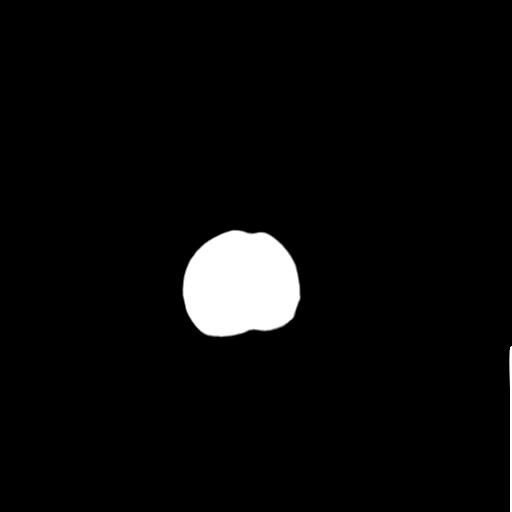
[im 27/29  bone]
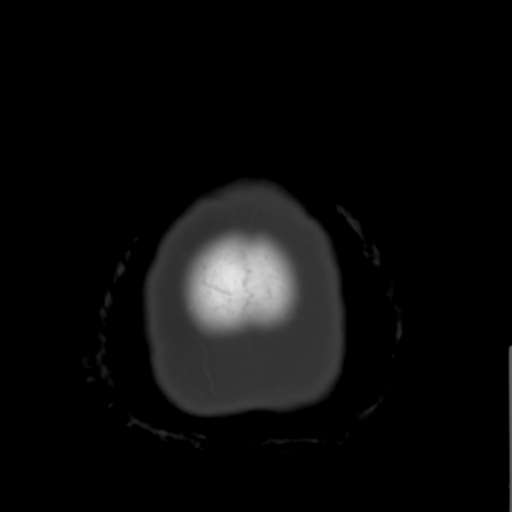

[Series 4: coronal soft tissue · coronal · 0.30mm/px · 3 of 68 slices shown]
[im 23/68  brain]
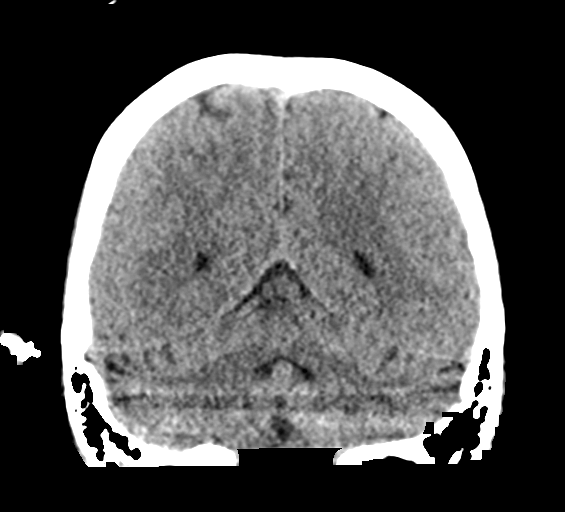
[im 30/68  brain]
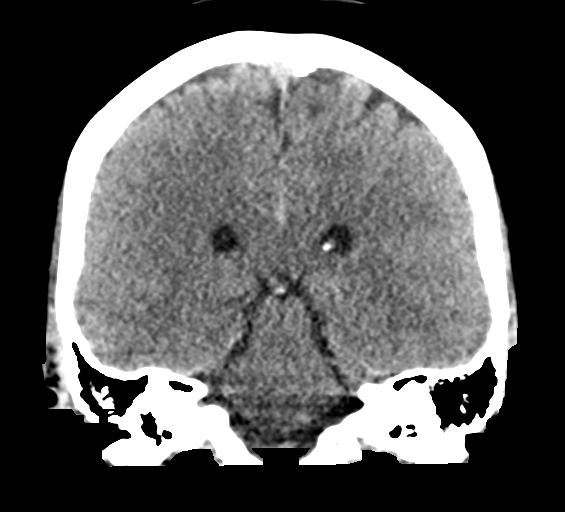
[im 38/68  brain]
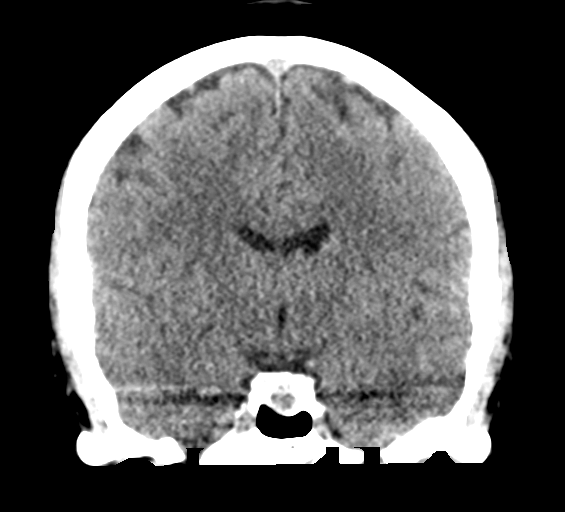

[Series 5: sagittal soft tissue · sagittal · 0.30mm/px · 3 of 51 slices shown]
[im 17/51  brain]
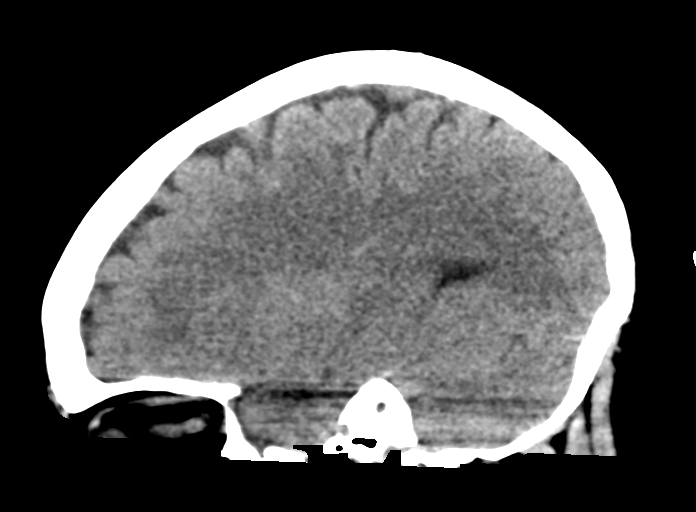
[im 26/51  brain]
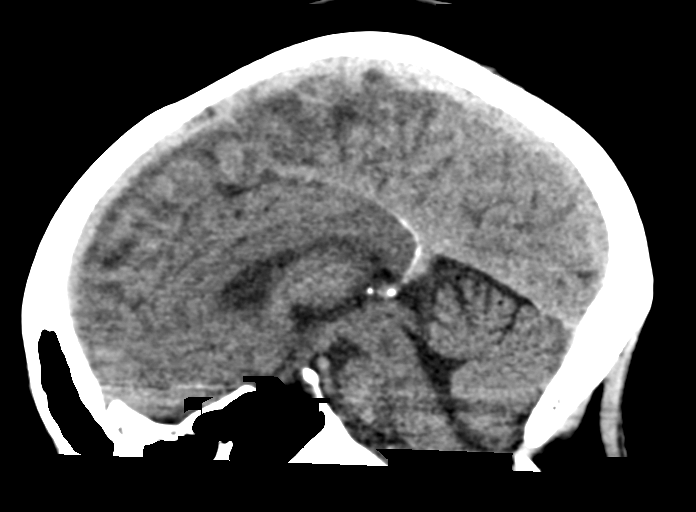
[im 34/51  brain]
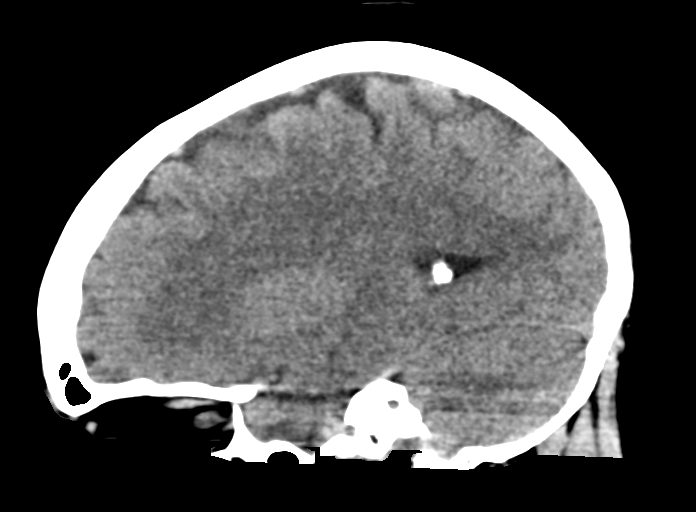

[15 of 46 positions shown; findings below may reference images not displayed]

FINDINGS: Brain: No acute infarct or hemorrhage. Lateral ventricles and
midline structures are unremarkable. No acute extra-axial fluid
collections. No mass effect.

Vascular: No hyperdense vessel or unexpected calcification.

Skull: Normal. Negative for fracture or focal lesion.

Sinuses/Orbits: No acute finding.

Other: None.
IMPRESSION: 1. No acute intracranial process.
